# Patient Record
Sex: Female | Born: 1940 | Race: White | Hispanic: No | Marital: Married | Smoking: Former smoker
Health system: Southern US, Community
[De-identification: ages and names within clinical notes are randomized; demographics above are authoritative.]

## PROBLEM LIST (undated history)

## (undated) DIAGNOSIS — D649 Anemia, unspecified: Secondary | ICD-10-CM

## (undated) DIAGNOSIS — K743 Primary biliary cirrhosis: Secondary | ICD-10-CM

## (undated) DIAGNOSIS — E559 Vitamin D deficiency, unspecified: Secondary | ICD-10-CM

## (undated) DIAGNOSIS — M069 Rheumatoid arthritis, unspecified: Secondary | ICD-10-CM

## (undated) DIAGNOSIS — E7800 Pure hypercholesterolemia, unspecified: Secondary | ICD-10-CM

## (undated) DIAGNOSIS — I10 Essential (primary) hypertension: Secondary | ICD-10-CM

## (undated) HISTORY — PX: LIVER BIOPSY: SHX301

---
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
## 2011-09-11 HISTORY — PX: NASAL POLYP EXCISION: SHX2068

## 2012-05-24 ENCOUNTER — Observation Stay: Payer: Self-pay | Admitting: Internal Medicine

## 2012-05-24 LAB — COMPREHENSIVE METABOLIC PANEL
Albumin: 3.3 g/dL — ABNORMAL LOW (ref 3.4–5.0)
Alkaline Phosphatase: 109 U/L (ref 50–136)
BUN: 16 mg/dL (ref 7–18)
EGFR (Non-African Amer.): >60
Glucose: 103 mg/dL — ABNORMAL HIGH (ref 65–99)
Osmolality: 273 (ref 275–301)
Potassium: 4.1 mmol/L (ref 3.5–5.1)
SGOT(AST): 15 U/L (ref 15–37)
Sodium: 136 mmol/L (ref 136–145)
Total Protein: 8.1 g/dL (ref 6.4–8.2)

## 2012-05-24 LAB — CBC
HCT: 40 % (ref 35.0–47.0)
HGB: 13.4 g/dL (ref 12.0–16.0)
MCH: 28.1 pg (ref 26.0–34.0)
MCHC: 33.5 g/dL (ref 32.0–36.0)
RDW: 15.5 % — ABNORMAL HIGH (ref 11.5–14.5)
WBC: 13.4 10*3/uL — ABNORMAL HIGH (ref 3.6–11.0)

## 2012-05-24 LAB — PROTIME-INR
INR: 0.9
Prothrombin Time: 12.5 secs (ref 11.5–14.7)

## 2012-05-24 LAB — APTT: Activated PTT: 29.3 secs (ref 23.6–35.9)

## 2012-05-25 LAB — CBC WITH DIFFERENTIAL/PLATELET
Eosinophil %: 1 %
HGB: 12.6 g/dL (ref 12.0–16.0)
MCHC: 34.1 g/dL (ref 32.0–36.0)
MCV: 84 fL (ref 80–100)
Monocyte %: 4.7 %
Neutrophil #: 7.7 10*3/uL — ABNORMAL HIGH (ref 1.4–6.5)
Neutrophil %: 76.6 %
Platelet: 270 10*3/uL (ref 150–440)
RDW: 15.3 % — ABNORMAL HIGH (ref 11.5–14.5)
WBC: 10.1 10*3/uL (ref 3.6–11.0)

## 2012-05-25 LAB — BASIC METABOLIC PANEL
Anion Gap: 5 — ABNORMAL LOW (ref 7–16)
BUN: 13 mg/dL (ref 7–18)
Calcium, Total: 9.3 mg/dL (ref 8.5–10.1)
Co2: 27 mmol/L (ref 21–32)
Creatinine: 0.85 mg/dL (ref 0.60–1.30)
EGFR (African American): >60
EGFR (Non-African Amer.): >60
Glucose: 107 mg/dL — ABNORMAL HIGH (ref 65–99)
Osmolality: 273 (ref 275–301)
Potassium: 4.2 mmol/L (ref 3.5–5.1)
Sodium: 136 mmol/L (ref 136–145)

## 2012-06-03 ENCOUNTER — Ambulatory Visit: Payer: Self-pay | Admitting: Otolaryngology

## 2012-06-05 ENCOUNTER — Ambulatory Visit: Payer: Self-pay | Admitting: Otolaryngology

## 2012-06-06 LAB — PATHOLOGY REPORT

## 2013-11-18 ENCOUNTER — Ambulatory Visit: Payer: Self-pay | Admitting: Family Medicine

## 2014-12-24 ENCOUNTER — Other Ambulatory Visit: Payer: Self-pay | Admitting: Family Medicine

## 2014-12-24 DIAGNOSIS — Z13820 Encounter for screening for osteoporosis: Secondary | ICD-10-CM

## 2014-12-24 DIAGNOSIS — Z1231 Encounter for screening mammogram for malignant neoplasm of breast: Secondary | ICD-10-CM

## 2014-12-28 NOTE — Discharge Summary (Signed)
PATIENT NAME:  ARIANIE, COUSE MR#:  [id number] DATE OF BIRTH:  10/04/1940  DATE OF ADMISSION:  05/24/2012 DATE OF DISCHARGE:  05/25/2012  ADDENDUM  Was counseled regarding smoking cessation. Five minutes were spent on smoking cessation. She was offered a nicotine patch which she will use at home. She is strongly recommended to quit.   ____________________________ Lafonda Mosses. Posey Pronto, MD shp:cms D: 05/25/2012 13:53:12 ET T: 05/26/2012 09:06:17 ET JOB#: [id number]  cc: Allye Hoyos H. Posey Pronto, MD, <Dictator> Alric Seton MD ELECTRONICALLY SIGNED 05/29/2012 21:48

## 2014-12-28 NOTE — Op Note (Signed)
PATIENT NAME:  Sandra [name], Sandra [name] MR#:  [id number] DATE OF BIRTH:  January 06, 1941  DATE OF PROCEDURE:  06/05/2012  PREOPERATIVE DIAGNOSIS: Nasopharyngeal mass.  POSTOPERATIVE DIAGNOSIS: Nasopharyngeal mass.   PROCEDURE: Nasopharyngoscopy with nasopharyngeal biopsies.   SURGEON: Janalee Dane, MD   DESCRIPTION OF PROCEDURE: The patient was placed in the supine position on the operating room table. After general endotracheal anesthesia had been induced, the patient was turned 90 degrees counterclockwise from anesthesia. The nose was anesthetized with phenylephrine lidocaine soaked pledgets two on each side of the nose. The patient was prepped and draped in the usual fashion. The pledgets were removed. The 0 degree Hopkins rod was used to perform endoscopy. The nasopharyngeal mass was photographed and biopsied with through-cutting forceps multiple times. The nasopharynx was then conservatively cauterized with suction cautery. A conservative amount of Surgiflo was then placed basically painted in the nasopharynx and along the inferior turbinate mucosa bilaterally. The patient was then returned to anesthesia, allowed to emerge from anesthesia in the operating room, and taken to the recovery room in stable condition. There were no complications. Estimated blood loss less than 10 mL.   ____________________________ J. Nadeen Landau, MD jmc:drc D: 06/05/2012 15:34:12 ET T: 06/05/2012 17:30:36 ET JOB#: [id number]  cc: Janalee Dane, MD, <Dictator> Nicholos Johns MD ELECTRONICALLY SIGNED 07/10/2012 10:30

## 2014-12-28 NOTE — Consult Note (Signed)
PATIENT NAME:  Sandra [name], Sandra [name] MR#:  [id number] DATE OF BIRTH:  October 05, 1940  DATE OF CONSULTATION:  05/24/2012  REFERRING PHYSICIAN:  Dr. Dustin Flock  CONSULTING PHYSICIAN:  Janalee Dane, MD  HISTORY OF PRESENT ILLNESS: The patient is a 74 year old white female who was admitted earlier today for nosebleed and transient hypertension thought to be related to the stress of the nosebleed and discomfort from packing the nose. The patient has not seen a physician in five years. She smokes fairly heavily, admits to about a pack of cigarettes a day. She has not had previous nosebleeds, denies any previous nasal or sinus surgery.   ALLERGIES: None.   MEDICATIONS: Metoprolol (since admission).   SOCIAL HISTORY: Smokes a pack of cigarettes a day.   FAMILY HISTORY: Negative for family history of coagulopathies.   PHYSICAL EXAMINATION:  GENERAL: The patient is anxious dabbing constantly at the nasal tampons, collecting all of the Kleenexes.    NOSE: There is a Merocel in the right naris. There is a [employer] in the left naris. There is no active bleeding anteriorly.   ORAL CAVITY AND OROPHARYNX: There is no bleeding posteriorly. Poor dentition.   NECK: The trachea is midline. No masses in the neck.   IMPRESSION AND RECOMMENDATIONS: Epistaxis likely related to dryness from the smoking and recent change in weather. I have recommended a humidifier to be used at the bedside. She can take that home with her when she is discharged. The patient is already on antibiotics for prophylactic coverage of toxic shock syndrome thanks to Dr. Posey Pronto. Essentially the packs will need to stay in for five days. They can be removed in our clinic and we will scope her at that time to look for any other causes of bleeding but essentially at this point the packs need to apply the pressure necessary to keep this bleeding from recurring. In the meantime her blood pressure should be well controlled which it is now and I  have reinforced the need with the patient and her daughter to avoid dabbing at the nose constantly.   ____________________________ J. Nadeen Landau, MD jmc:cms D: 05/24/2012 12:49:16 ET T: 05/24/2012 13:16:01 ET JOB#: [id number]  cc: Janalee Dane, MD, <Dictator> [facility] Nicholos Johns MD ELECTRONICALLY SIGNED 06/05/2012 7:38

## 2014-12-28 NOTE — Discharge Summary (Signed)
PATIENT NAME:  Sandra [name], Sandra [name] MR#:  [id number] DATE OF BIRTH:  November 26, 1940  DATE OF ADMISSION:  05/24/2012 DATE OF DISCHARGE:  05/25/2012  ADMITTING DIAGNOSIS: Severe nasal bleed.   DISCHARGE DIAGNOSES:  1. Nasal bleed, possibly related to dry nose. Patient was seen by ear, nose and throat, has both of her nostrils packed. She will need to follow up with Dr. Carlis Abbott. Will need direct visualization of her nares. 2. Elevated blood pressure possibly related to the nasal bleed. It was not extremely elevated to caused the bleeding. However, since admission her blood pressure has been stable with low dose metoprolol.  3. Nausea related to bleeding from the nose causing gastric irritation, now resolved.  4. Nicotine addiction. Patient counseled and offered nicotine patch to assist with smoking cessation.   CONSULTANT: Dr. Carlis Abbott.   LABORATORY, DIAGNOSTIC AND RADIOLOGICAL DATA: INR 0.9. WBC 13.4 on admission. Hemoglobin 13.4, platelet count 297. Most recent hemoglobin 12.6. BMP glucose 103, BUN 16, creatinine 0.41, sodium 136, potassium 4.1, chloride 107, CO2 23, calcium 9.3. LFTs were normal except albumin of 3.3.    HOSPITAL COURSE: Please refer to history and physical done yesterday. Patient is a pleasant 74 year old white female with no significant past medical history except smokes presented with complaint of having severe nasal bleed that woke her up. Patient had a similar episode of bleeding a few days prior which stopped on its own. Patient came to the ED and had to have bilateral nasal packing. Her blood pressure when she arrived was in the 170s. She received a dose of Lopressor and blood pressure did decrease. It was unclear whether the elevated blood pressure could have been related to her having acute bleed. Just being on low dose Lopressor her blood is stable at 132/66. She was seen in consultation by ENT, Dr. Carlis Abbott, who felt that her bleeding could have been related to dryness from the smoking  and recent changes in the weather. He recommended patient use a humidifier. Also recommended prophylactic antibiotic and recommended that nasal packs to stay in her nose. These will be removed in his clinic. Patient over the night has had a little bit of bleeding, but nothing significant. Her hemoglobin is stable. At this time she is stable for discharge.   DISCHARGE MEDICATIONS:  1. Metoprolol tartrate 12.5, 1 tab p.o. b.i.d.  2. Augmentin 1 tab p.o. b.i.d. x5 days.  3. Nicotine patch 14 mg, change daily.   DIET: Low sodium.   TIMEFRAME FOR FOLLOW UP: 1 to 2 weeks with Dr. Carlis Abbott, ENT. Also patient needs to establish primary M.D. Patient is told to stop smoking. Also use humidifier all the time. She is to obtain a blood pressure cuff, check blood pressure twice daily. Blood pressure starts trending down or if systolic starts going below 102 she is to stop to metoprolol.   TIME SPENT: 35 minutes.   ____________________________ Lafonda Mosses Posey Pronto, MD shp:cms D: 05/25/2012 13:28:49 ET T: 05/26/2012 08:59:50 ET JOB#: [id number]  cc: Cayle Cordoba H. Posey Pronto, MD, <Dictator> Alric Seton MD ELECTRONICALLY SIGNED 05/29/2012 21:48

## 2014-12-28 NOTE — H&P (Signed)
PATIENT NAME:  Sandra [name], Sandra [name] MR#:  [id number] DATE OF BIRTH:  01/29/1941  DATE OF ADMISSION:  05/24/2012  PRIMARY CARE PHYSICIAN: None  ED REFERRING PHYSICIAN: Dr. Baron Sane   CHIEF COMPLAINT: Severe nasal bleed.   HISTORY OF PRESENT ILLNESS: Patient is a 74 year old white female with no significant past medical history, has not seen a physician in five years, who reports that last week she had an episode where she started bleeding from her nose spontaneously and then it stopped on its own and then earlier today she was sleeping and all of a sudden she started having nasal bleed. Patient had severe nasal bleed which continued to persist when the ED M.D. arrived. She had to have nasal packing for the bleed. Patient reports no trauma to the nose. She denies any history of spontaneous bleeding. She has no known history of hypertension. Her blood pressure was slightly elevated on presentation. She received a dose of metoprolol and her blood pressure is 143/67 currently. Otherwise, patient denies any fevers, chills. No cough. Denies any significant allergies. Has not noticed any type of growth in her nose. She otherwise complains of some nausea but no emesis. She denies any chest pain or shortness of breath.   PAST MEDICAL HISTORY: None.   PAST SURGICAL HISTORY: None.   ALLERGIES: None.   MEDICATIONS: None.   SOCIAL HISTORY: Does not smoke. Does not drink. No drugs.   FAMILY HISTORY: No history of easy bleeding. No history of any problems with hemostasis.   REVIEW OF SYSTEMS: CONSTITUTIONAL: Denies any fevers, fatigue, weakness. No pain, weight loss. No weight gain. EYES: No blurred or double vision. No pain. No redness. No inflammation. No glaucoma. No cataracts. ENT: No tinnitus. No ear pain. No hearing loss. No seasonal or year-round allergies. Has complained of epistaxis. No nasal discharge prior to the bleeding. No snoring. No postnasal drip. No sinus pain. No dentures. No redness of  the oropharynx. No difficulty swallowing. RESPIRATORY: No cough. No wheezing. No hemoptysis. No chronic obstructive pulmonary disease. CARDIOVASCULAR: No chest pain. No orthopnea. No edema. No arrhythmia. GASTROINTESTINAL: Complains of some nausea. No abdominal pain. No hematemesis. No melena. No ulcer. No gastroesophageal reflux disease. No irritable bowel syndrome. GENITOURINARY: Denies any dysuria, hematuria, renal calculus or frequency. ENDO: Denies any polydipsia, nocturia, or thyroid problems. HEME/LYMPH: Denies anemia, easy bruisability, or bleeding. SKIN: No acne. No rash. No changes in mole, hair or skin. MUSCULOSKELETAL: Denies any pain in neck, back, or shoulder. NEURO: No numbness. No cerebrovascular accident. No transient ischemic attack. PSYCHIATRIC: No anxiety. No insomnia. No ADD.   PHYSICAL EXAMINATION:  VITAL SIGNS: Temperature 97.6, pulse 64, respirations 20, blood pressure 143/67, O2 97%.   GENERAL: Patient is a well developed, well nourished Caucasian female in no acute distress.   HEENT: Head atraumatic, normocephalic. Extraocular movements intact. There is no conjunctival pallor. No scleral icterus. Nasal exam shows that she has got both of her nostrils packed. The packing is bloody. She has no current active bleeding noted. Oropharynx is clear without any exudates.   NECK: No thyromegaly. No carotid bruits.   CARDIOVASCULAR: Regular rate and rhythm. No murmurs, rubs, clicks, or gallops. PMI is not displaced.   LUNGS: Clear to auscultation bilaterally without any rales, rhonchi, wheezing.   ABDOMEN: Soft, nontender, nondistended. Positive bowel sounds x4.   EXTREMITIES: No clubbing, cyanosis, edema.   SKIN: No rash.   LYMPHATICS: No lymph nodes palpable.   VASCULAR: Good DP, PT pulses.   PSYCHIATRIC: Not  anxious or depressed.   NEUROLOGICAL: Awake, alert, oriented x3. No focal deficits. Cranial nerves II through XII grossly intact.   LABORATORY, DIAGNOSTIC, AND  RADIOLOGICAL DATA: Blood glucose 103, BUN 16, creatinine 0.41, sodium 136, chloride 107, CO2 23, calcium 9.3. LFTs were normal except albumin of 3.3. WBC 13.4, hemoglobin 13.4, platelet count 297, INR 0.9.   ASSESSMENT AND PLAN: Patient is a 74 year old healthy female presents with acute onset of nasal bleed. 1. Severe nasal bleed. Patient's platelet count and INR are normal suggestive of likely spontaneous bleed. ENT will see the patient. The ED physician has spoken to Dr. Carlis Abbott already. Patient will eventually need visualization of her nasal canal to make sure that there is no growth or any other abnormality causing her to have a bleed such as this. Her blood pressure was slightly elevated on presentation, could be just related to the bleed itself. At this time will give her low dose metoprolol. She did have metoprolol given in the ED.   2. Elevated blood pressure, likely due to the bleed, however, will follow her blood pressure. Start on low dose metoprolol.  3. Elevated WBC count, likely stress induced due to bleed. In light of her acute bleed will place her on p.o. antibiotics with Augmentin.  4. Miscellaneous. Patient is ambulatory. She does not need any anticoagulation especially with the bleed.   TIME SPENT: 35 minutes.  ____________________________ Lafonda Mosses Posey Pronto, MD shp:cms D: 05/24/2012 08:46:58 ET T: 05/24/2012 09:10:08 ET JOB#: [id number]  cc: Yago Ludvigsen H. Posey Pronto, MD, <Dictator> Alric Seton MD ELECTRONICALLY SIGNED 05/24/2012 16:26

## 2015-01-18 ENCOUNTER — Ambulatory Visit
Admission: RE | Admit: 2015-01-18 | Discharge: 2015-01-18 | Disposition: A | Discharge status: Home / Self Care | Payer: Medicare PPO | Source: Ambulatory Visit | Attending: Family Medicine | Admitting: Family Medicine

## 2015-01-18 DIAGNOSIS — Z1231 Encounter for screening mammogram for malignant neoplasm of breast: Secondary | ICD-10-CM

## 2015-01-18 DIAGNOSIS — Z13820 Encounter for screening for osteoporosis: Secondary | ICD-10-CM

## 2015-11-23 ENCOUNTER — Ambulatory Visit
Admission: RE | Admit: 2015-11-23 | Discharge: 2015-11-23 | Disposition: A | Discharge status: Home / Self Care | Payer: Medicare PPO | Source: Ambulatory Visit | Attending: Family Medicine | Admitting: Family Medicine

## 2015-11-23 ENCOUNTER — Other Ambulatory Visit: Payer: Self-pay | Admitting: Family Medicine

## 2015-11-23 DIAGNOSIS — M25462 Effusion, left knee: Secondary | ICD-10-CM | POA: Insufficient documentation

## 2015-11-23 DIAGNOSIS — M25461 Effusion, right knee: Secondary | ICD-10-CM | POA: Insufficient documentation

## 2015-11-23 DIAGNOSIS — M1711 Unilateral primary osteoarthritis, right knee: Secondary | ICD-10-CM | POA: Diagnosis not present

## 2015-11-23 DIAGNOSIS — M25562 Pain in left knee: Principal | ICD-10-CM

## 2015-11-23 DIAGNOSIS — M25561 Pain in right knee: Secondary | ICD-10-CM

## 2015-11-23 IMAGING — CR DG KNEE AP/LAT W/ SUNRISE*L*
3 series · 3 of 3 positions shown · non-contrast
Comparison: None.

CLINICAL DATA: Bilateral knee pain, swelling beginning 6-7 months
ago.

EXAM:
LEFT KNEE 3 VIEWS

[knee ap]
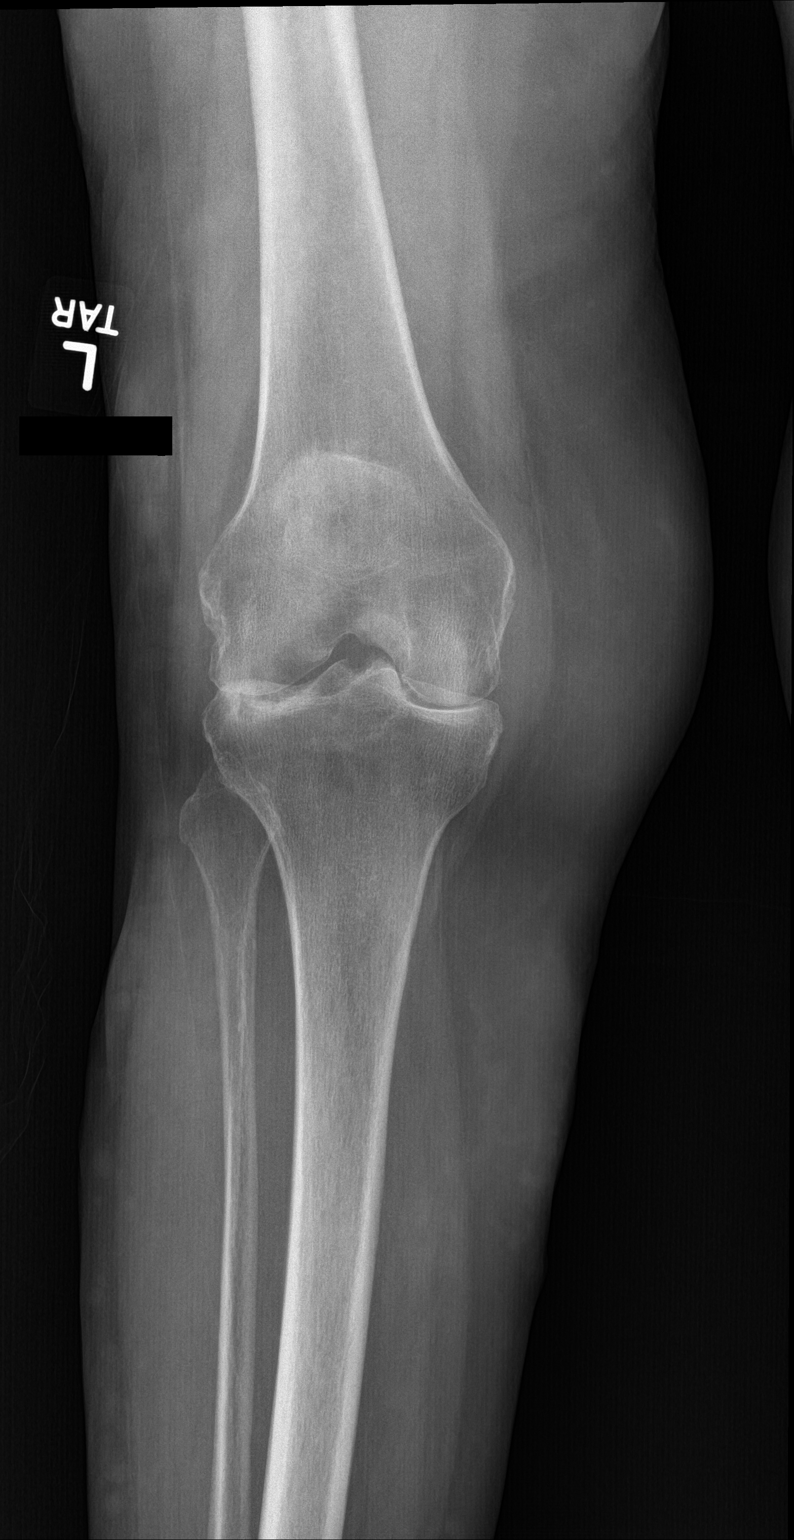

[knee lat]
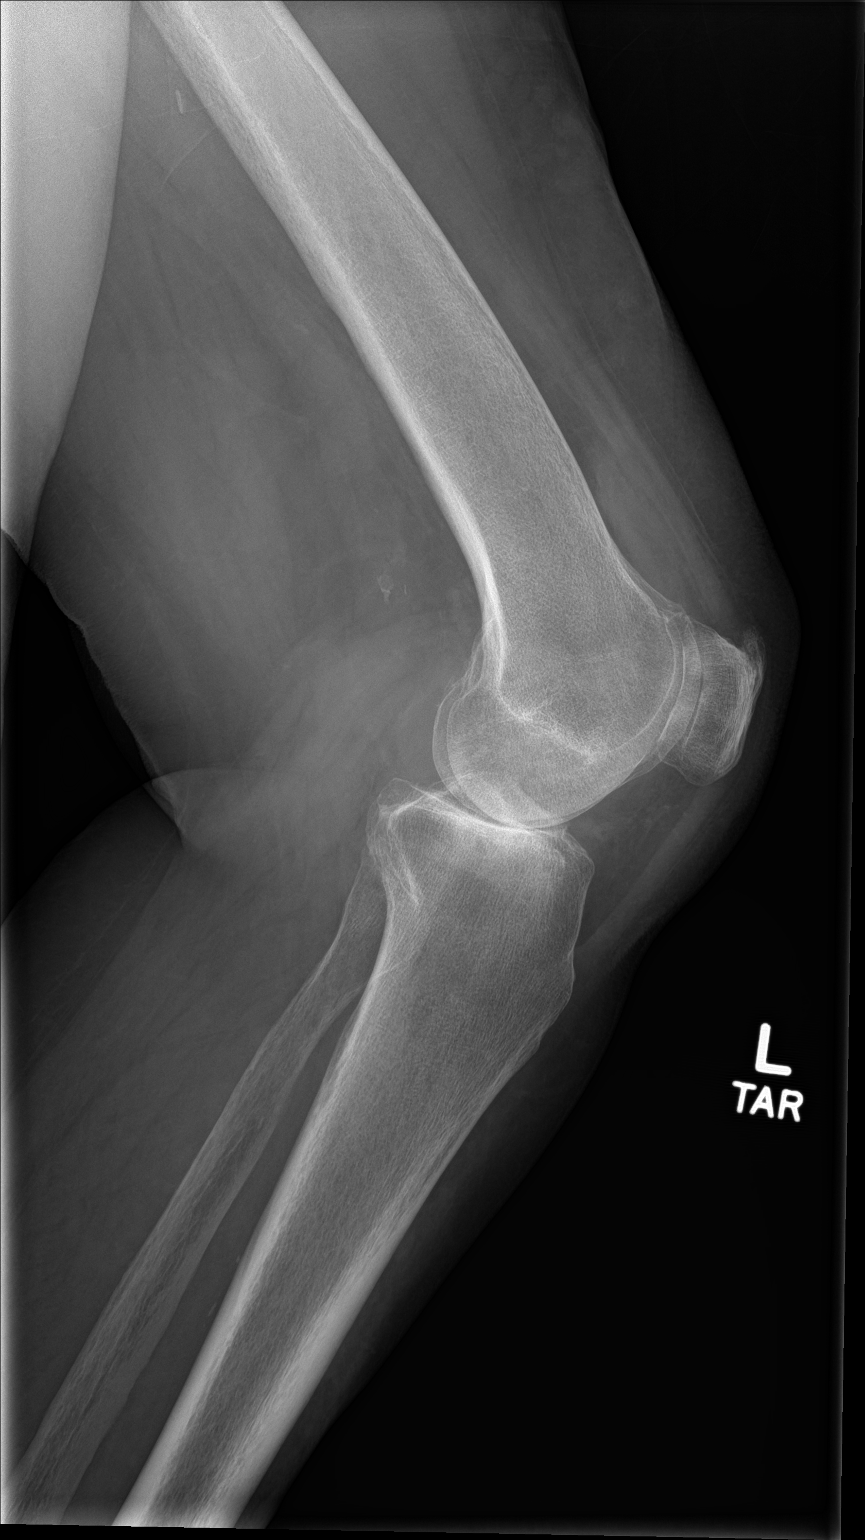

[patella skyline]
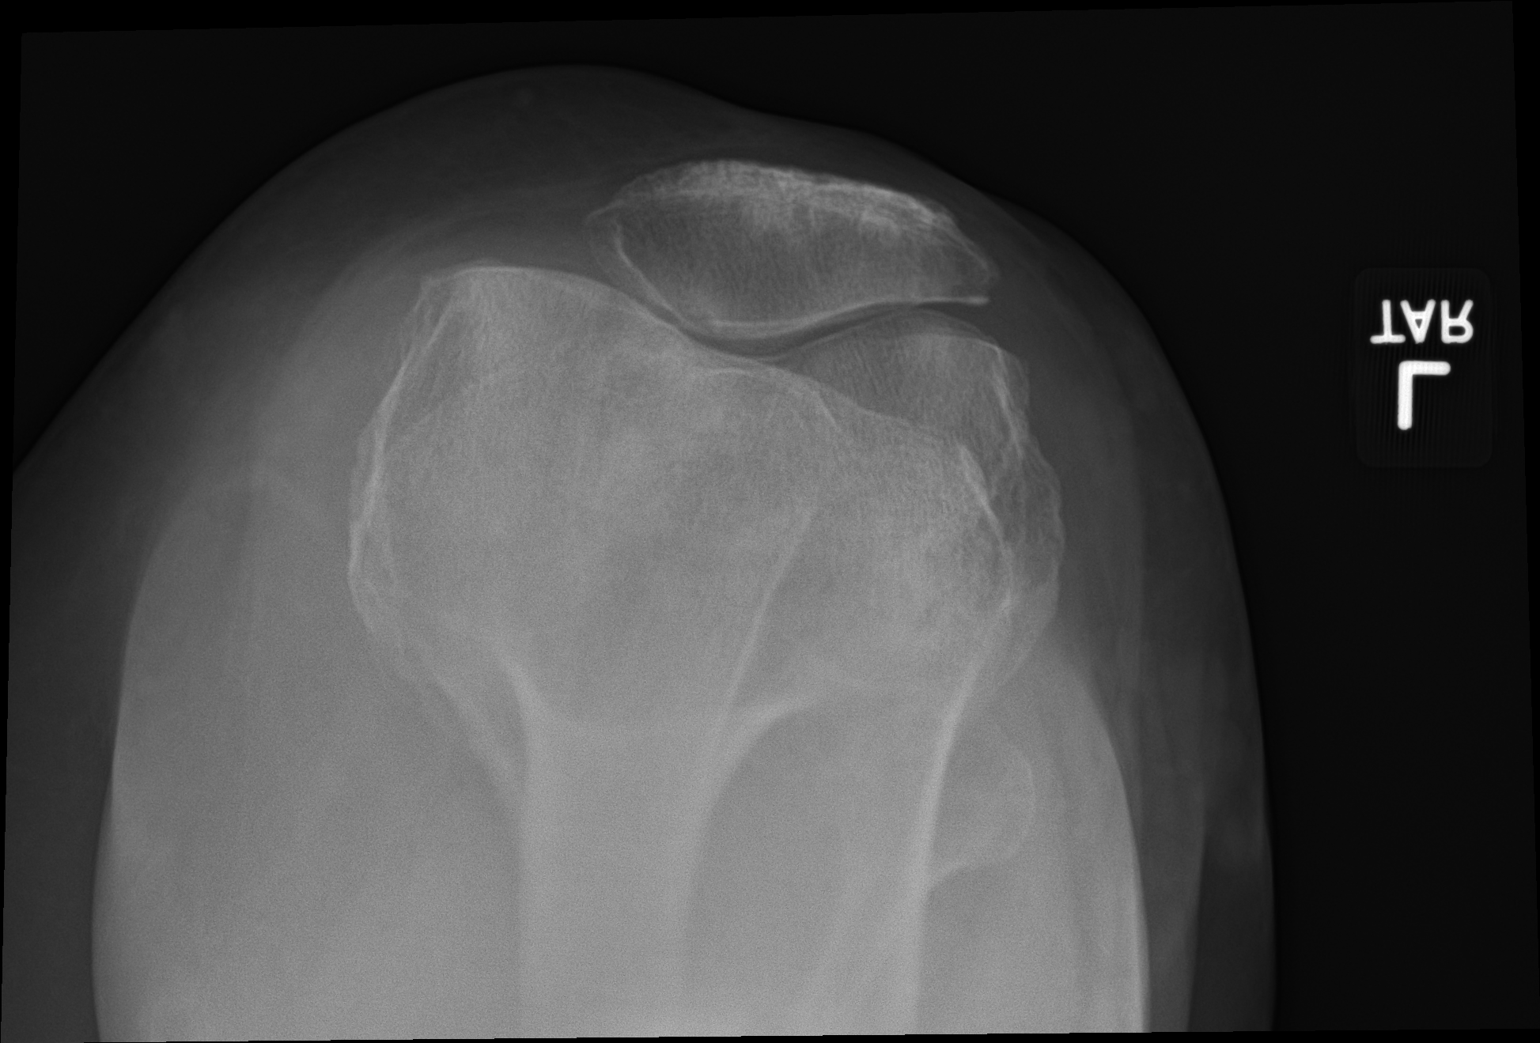

[3 of 3 positions shown; findings below may reference images not displayed]

FINDINGS: Advanced tricompartment degenerative changes with joint space loss,
subchondral sclerosis, and osteophyte formation. Small to moderate
joint effusion. No acute bony abnormality. Specifically, no
fracture, subluxation, or dislocation. Soft tissues are intact.
IMPRESSION: Advanced degenerative changes with small to moderate joint effusion.
No acute bony abnormality.

## 2016-08-10 ENCOUNTER — Other Ambulatory Visit: Payer: Self-pay | Admitting: Internal Medicine

## 2016-08-10 DIAGNOSIS — K824 Cholesterolosis of gallbladder: Secondary | ICD-10-CM

## 2016-08-21 ENCOUNTER — Ambulatory Visit
Admission: RE | Admit: 2016-08-21 | Discharge: 2016-08-21 | Disposition: A | Discharge status: Home / Self Care | Payer: Medicare PPO | Source: Ambulatory Visit | Attending: Internal Medicine | Admitting: Internal Medicine

## 2016-08-21 DIAGNOSIS — K824 Cholesterolosis of gallbladder: Secondary | ICD-10-CM | POA: Diagnosis not present

## 2016-09-10 HISTORY — PX: CHOLECYSTECTOMY: SHX55

## 2019-03-24 ENCOUNTER — Other Ambulatory Visit: Payer: Self-pay | Admitting: Rheumatology

## 2019-03-24 DIAGNOSIS — M0579 Rheumatoid arthritis with rheumatoid factor of multiple sites without organ or systems involvement: Secondary | ICD-10-CM

## 2019-03-24 DIAGNOSIS — Z7952 Long term (current) use of systemic steroids: Secondary | ICD-10-CM

## 2019-04-01 ENCOUNTER — Ambulatory Visit
Admission: RE | Admit: 2019-04-01 | Discharge: 2019-04-01 | Disposition: A | Discharge status: Home / Self Care | Payer: Medicare PPO | Source: Ambulatory Visit | Attending: Rheumatology | Admitting: Rheumatology

## 2019-04-01 ENCOUNTER — Other Ambulatory Visit: Payer: Self-pay

## 2019-04-01 ENCOUNTER — Encounter (INDEPENDENT_AMBULATORY_CARE_PROVIDER_SITE_OTHER): Payer: Self-pay

## 2019-04-01 DIAGNOSIS — M0579 Rheumatoid arthritis with rheumatoid factor of multiple sites without organ or systems involvement: Secondary | ICD-10-CM | POA: Diagnosis present

## 2019-04-01 DIAGNOSIS — Z7952 Long term (current) use of systemic steroids: Secondary | ICD-10-CM | POA: Diagnosis present

## 2019-07-16 ENCOUNTER — Other Ambulatory Visit
Admission: RE | Admit: 2019-07-16 | Discharge: 2019-07-16 | Disposition: A | Discharge status: Home / Self Care | Payer: Medicare PPO | Source: Ambulatory Visit | Attending: Ophthalmology | Admitting: Ophthalmology

## 2019-07-16 ENCOUNTER — Other Ambulatory Visit: Payer: Self-pay

## 2019-07-16 DIAGNOSIS — Z01812 Encounter for preprocedural laboratory examination: Secondary | ICD-10-CM | POA: Insufficient documentation

## 2019-07-16 DIAGNOSIS — Z20828 Contact with and (suspected) exposure to other viral communicable diseases: Secondary | ICD-10-CM | POA: Insufficient documentation

## 2019-07-16 LAB — SARS CORONAVIRUS 2 (TAT 6-24 HRS): SARS Coronavirus 2: NEGATIVE

## 2019-07-16 NOTE — Discharge Instructions (Signed)

## 2019-07-20 ENCOUNTER — Ambulatory Visit: Payer: Medicare PPO | Admitting: Anesthesiology

## 2019-07-20 ENCOUNTER — Ambulatory Visit
Admission: RE | Admit: 2019-07-20 | Discharge: 2019-07-20 | Disposition: A | Discharge status: Home / Self Care | Payer: Medicare PPO | Attending: Ophthalmology | Admitting: Ophthalmology

## 2019-07-20 ENCOUNTER — Other Ambulatory Visit: Payer: Self-pay

## 2019-07-20 ENCOUNTER — Encounter
Admission: RE | Disposition: A | Discharge status: Home / Self Care | Payer: Self-pay | Source: Home / Self Care | Attending: Ophthalmology

## 2019-07-20 DIAGNOSIS — K743 Primary biliary cirrhosis: Secondary | ICD-10-CM | POA: Diagnosis not present

## 2019-07-20 DIAGNOSIS — E7800 Pure hypercholesterolemia, unspecified: Secondary | ICD-10-CM | POA: Diagnosis not present

## 2019-07-20 DIAGNOSIS — Z7983 Long term (current) use of bisphosphonates: Secondary | ICD-10-CM | POA: Diagnosis not present

## 2019-07-20 DIAGNOSIS — Z7952 Long term (current) use of systemic steroids: Secondary | ICD-10-CM | POA: Diagnosis not present

## 2019-07-20 DIAGNOSIS — H2512 Age-related nuclear cataract, left eye: Secondary | ICD-10-CM | POA: Insufficient documentation

## 2019-07-20 DIAGNOSIS — Z79899 Other long term (current) drug therapy: Secondary | ICD-10-CM | POA: Insufficient documentation

## 2019-07-20 DIAGNOSIS — M069 Rheumatoid arthritis, unspecified: Secondary | ICD-10-CM | POA: Insufficient documentation

## 2019-07-20 DIAGNOSIS — Z87891 Personal history of nicotine dependence: Secondary | ICD-10-CM | POA: Diagnosis not present

## 2019-07-20 DIAGNOSIS — I10 Essential (primary) hypertension: Secondary | ICD-10-CM | POA: Insufficient documentation

## 2019-07-20 HISTORY — DX: Primary biliary cirrhosis: K74.3

## 2019-07-20 HISTORY — DX: Anemia, unspecified: D64.9

## 2019-07-20 HISTORY — DX: Pure hypercholesterolemia, unspecified: E78.00

## 2019-07-20 HISTORY — DX: Rheumatoid arthritis, unspecified: M06.9

## 2019-07-20 HISTORY — DX: Essential (primary) hypertension: I10

## 2019-07-20 HISTORY — PX: CATARACT EXTRACTION W/PHACO: SHX586

## 2019-07-20 SURGERY — PHACOEMULSIFICATION, CATARACT, WITH IOL INSERTION
Anesthesia: Monitor Anesthesia Care | Site: Eye | Laterality: Left

## 2019-07-20 MED ORDER — MIDAZOLAM HCL 2 MG/2ML IJ SOLN
INTRAMUSCULAR | Status: DC | PRN
  Administered 2019-07-20 (×2): 0.5 mg via INTRAVENOUS

## 2019-07-20 MED ORDER — EPINEPHRINE PF 1 MG/ML IJ SOLN
INTRAOCULAR | Status: DC | PRN
  Administered 2019-07-20: 93 mL via OPHTHALMIC

## 2019-07-20 MED ORDER — FENTANYL CITRATE (PF) 100 MCG/2ML IJ SOLN
INTRAMUSCULAR | Status: DC | PRN
  Administered 2019-07-20: 25 ug via INTRAVENOUS
  Administered 2019-07-20: 50 ug via INTRAVENOUS

## 2019-07-20 MED ORDER — ARMC OPHTHALMIC DILATING DROPS
1.0000 "application " | OPHTHALMIC | Status: DC | PRN
  Administered 2019-07-20 (×3): 1 via OPHTHALMIC

## 2019-07-20 MED ORDER — LIDOCAINE HCL (PF) 2 % IJ SOLN
INTRAOCULAR | Status: DC | PRN
  Administered 2019-07-20: 2 mL via INTRAOCULAR

## 2019-07-20 MED ORDER — MOXIFLOXACIN HCL 0.5 % OP SOLN
OPHTHALMIC | Status: DC | PRN
  Administered 2019-07-20: 0.2 mL via OPHTHALMIC

## 2019-07-20 MED ORDER — SODIUM HYALURONATE 23 MG/ML IO SOLN
INTRAOCULAR | Status: DC | PRN
  Administered 2019-07-20: 0.6 mL via INTRAOCULAR

## 2019-07-20 MED ORDER — TETRACAINE HCL 0.5 % OP SOLN
1.0000 [drp] | OPHTHALMIC | Status: DC | PRN
  Administered 2019-07-20 (×3): 1 [drp] via OPHTHALMIC

## 2019-07-20 MED ORDER — ONDANSETRON HCL 4 MG/2ML IJ SOLN: 4.0000 mg | Freq: Once | INTRAMUSCULAR | Status: DC | PRN

## 2019-07-20 MED ORDER — LACTATED RINGERS IV SOLN: INTRAVENOUS | Status: DC

## 2019-07-20 MED ORDER — SODIUM HYALURONATE 10 MG/ML IO SOLN
INTRAOCULAR | Status: DC | PRN
  Administered 2019-07-20: 0.55 mL via INTRAOCULAR

## 2019-07-20 SURGICAL SUPPLY — 19 items
CANNULA ANT/CHMB 27G (MISCELLANEOUS) ×2 IMPLANT
CANNULA ANT/CHMB 27GA (MISCELLANEOUS) ×6 IMPLANT
DISSECTOR HYDRO NUCLEUS 50X22 (MISCELLANEOUS) ×3 IMPLANT
GLOVE SURG LX 7.5 STRW (GLOVE) ×2
GLOVE SURG LX STRL 7.5 STRW (GLOVE) ×1 IMPLANT
GLOVE SURG SYN 8.5  E (GLOVE) ×2
GLOVE SURG SYN 8.5 E (GLOVE) ×1 IMPLANT
GLOVE SURG SYN 8.5 PF PI (GLOVE) ×1 IMPLANT
GOWN STRL REUS W/ TWL LRG LVL3 (GOWN DISPOSABLE) ×2 IMPLANT
GOWN STRL REUS W/TWL LRG LVL3 (GOWN DISPOSABLE) ×4
LENS IOL TECNIS ITEC 21.0 (Intraocular Lens) ×2 IMPLANT
MARKER SKIN DUAL TIP RULER LAB (MISCELLANEOUS) ×3 IMPLANT
PACK DR. KING ARMS (PACKS) ×3 IMPLANT
PACK EYE AFTER SURG (MISCELLANEOUS) ×3 IMPLANT
PACK OPTHALMIC (MISCELLANEOUS) ×3 IMPLANT
SYR 3ML LL SCALE MARK (SYRINGE) ×3 IMPLANT
SYR TB 1ML LUER SLIP (SYRINGE) ×3 IMPLANT
WATER STERILE IRR 250ML POUR (IV SOLUTION) ×3 IMPLANT
WIPE NON LINTING 3.25X3.25 (MISCELLANEOUS) ×3 IMPLANT

## 2019-07-20 NOTE — Anesthesia Preprocedure Evaluation (Signed)
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   Endo/Other  negative endocrine ROS  Renal/GU negative Renal ROS     Musculoskeletal  (+) Arthritis , Rheumatoid disorders,    Abdominal   Peds  Hematology   Anesthesia Other Findings   Reproductive/Obstetrics                             Anesthesia Physical Anesthesia Plan  ASA: III  Anesthesia Plan: MAC   Post-op Pain Management:    Induction: Intravenous  PONV Risk Score and Plan: 2 and Treatment may vary due to age or medical condition and Midazolam  Airway Management Planned: Natural Airway and Nasal Cannula  Additional Equipment: None  Intra-op Plan:   Post-operative Plan:   Informed Consent: I have reviewed the patients History and Physical, chart, labs and discussed the procedure including the risks, benefits and alternatives for the proposed anesthesia with the patient or authorized representative who has indicated his/her understanding and acceptance.       Plan Discussed with:   Anesthesia Plan Comments:         Anesthesia Quick Evaluation

## 2019-07-20 NOTE — Anesthesia Procedure Notes (Signed)
Procedure Name: MAC Performed by: Kaisei Gilbo, CRNA Pre-anesthesia Checklist: Patient identified, Emergency Drugs available, Suction available, Timeout performed and Patient being monitored Patient Re-evaluated:Patient Re-evaluated prior to induction Oxygen Delivery Method: Nasal cannula Placement Confirmation: positive ETCO2       

## 2019-07-20 NOTE — Anesthesia Postprocedure Evaluation (Signed)
Anesthesia Post Note  Patient: Sandra [name]  Procedure(s) Performed: CATARACT EXTRACTION PHACO AND INTRAOCULAR LENS PLACEMENT (IOC) LEFT 01:15.7        13.3%       10.36 (Left Eye)     Patient location during evaluation: PACU Anesthesia Type: MAC Level of consciousness: awake and alert Pain management: pain level controlled Vital Signs Assessment: post-procedure vital signs reviewed and stable Respiratory status: spontaneous breathing, nonlabored ventilation, respiratory function stable and patient connected to nasal cannula oxygen Cardiovascular status: stable and blood pressure returned to baseline Postop Assessment: no apparent nausea or vomiting Anesthetic complications: no    Adele Barthel Sherrelle Prochazka

## 2019-07-20 NOTE — Op Note (Signed)
OPERATIVE NOTE  Sandra [name] FS:[id number] 07/20/2019   PREOPERATIVE DIAGNOSIS:  Nuclear sclerotic cataract left eye.  H25.12   POSTOPERATIVE DIAGNOSIS:    Nuclear sclerotic cataract left eye.     PROCEDURE:  Phacoemusification with posterior chamber intraocular lens placement of the left eye   LENS:   Implant Name Type Inv. Item Serial No. Manufacturer Lot No. LRB No. Used Action  LENS IOL DIOP 21.0 - [id number] Intraocular Lens LENS IOL DIOP 21.0 [id number]  Left 1 Implanted      Procedure(s): CATARACT EXTRACTION PHACO AND INTRAOCULAR LENS PLACEMENT (IOC) LEFT 01:15.7        13.3%       10.36 (Left)  PCB00 +21.0   ULTRASOUND TIME: 1 minutes 15 seconds.  CDE 10.36   SURGEON:  Benay Pillow, MD, MPH   ANESTHESIA:  Topical with tetracaine drops augmented with 1% preservative-free intracameral lidocaine.  ESTIMATED BLOOD LOSS: <1 mL   COMPLICATIONS:  None.   DESCRIPTION OF PROCEDURE:  The patient was identified in the holding room and transported to the operating room and placed in the supine position under the operating microscope.  The left eye was identified as the operative eye and it was prepped and draped in the usual sterile ophthalmic fashion.   A 1.0 millimeter clear-corneal paracentesis was made at the 5:00 position. 0.5 ml of preservative-free 1% lidocaine with epinephrine was injected into the anterior chamber.  The anterior chamber was filled with Healon 5 viscoelastic.  A 2.4 millimeter keratome was used to make a near-clear corneal incision at the 2:00 position.  A curvilinear capsulorrhexis was made with a cystotome and capsulorrhexis forceps.  Balanced salt solution was used to hydrodissect and hydrodelineate the nucleus.   Phacoemulsification was then used in stop and chop fashion to remove the lens nucleus and epinucleus.  The remaining cortex was then removed using the irrigation and aspiration handpiece. Healon was then placed into the capsular bag to  distend it for lens placement.  A lens was then injected into the capsular bag.  The remaining viscoelastic was aspirated.   Wounds were hydrated with balanced salt solution.  The anterior chamber was inflated to a physiologic pressure with balanced salt solution.  Intracameral vigamox 0.1 mL undiltued was injected into the eye and a drop placed onto the ocular surface.  No wound leaks were noted.  The patient was taken to the recovery room in stable condition without complications of anesthesia or surgery  Benay Pillow 07/20/2019, 11:25 AM

## 2019-07-20 NOTE — H&P (Signed)

## 2019-07-20 NOTE — Transfer of Care (Signed)
Immediate Anesthesia Transfer of Care Note  Patient: Sandra [name]  Procedure(s) Performed: CATARACT EXTRACTION PHACO AND INTRAOCULAR LENS PLACEMENT (IOC) LEFT 01:15.7        13.3%       10.36 (Left Eye)  Patient Location: PACU  Anesthesia Type: MAC  Level of Consciousness: awake, alert  and patient cooperative  Airway and Oxygen Therapy: Patient Spontanous Breathing and Patient connected to supplemental oxygen  Post-op Assessment: Post-op Vital signs reviewed, Patient's Cardiovascular Status Stable, Respiratory Function Stable, Patent Airway and No signs of Nausea or vomiting  Post-op Vital Signs: Reviewed and stable  Complications: No apparent anesthesia complications

## 2019-07-21 ENCOUNTER — Encounter: Payer: Self-pay | Admitting: Ophthalmology

## 2019-07-30 ENCOUNTER — Other Ambulatory Visit: Payer: Self-pay

## 2019-07-30 ENCOUNTER — Encounter: Payer: Self-pay | Admitting: *Deleted

## 2019-08-04 NOTE — Discharge Instructions (Signed)

## 2019-08-07 ENCOUNTER — Other Ambulatory Visit
Admission: RE | Admit: 2019-08-07 | Discharge: 2019-08-07 | Disposition: A | Discharge status: Home / Self Care | Payer: Medicare PPO | Source: Ambulatory Visit | Attending: Ophthalmology | Admitting: Ophthalmology

## 2019-08-07 DIAGNOSIS — Z20828 Contact with and (suspected) exposure to other viral communicable diseases: Secondary | ICD-10-CM | POA: Insufficient documentation

## 2019-08-07 DIAGNOSIS — Z01812 Encounter for preprocedural laboratory examination: Secondary | ICD-10-CM | POA: Diagnosis present

## 2019-08-07 LAB — SARS CORONAVIRUS 2 (TAT 6-24 HRS): SARS Coronavirus 2: NEGATIVE

## 2019-08-10 ENCOUNTER — Other Ambulatory Visit: Payer: Self-pay

## 2019-08-10 ENCOUNTER — Encounter
Admission: RE | Disposition: A | Discharge status: Home / Self Care | Payer: Self-pay | Source: Home / Self Care | Attending: Ophthalmology

## 2019-08-10 ENCOUNTER — Ambulatory Visit: Payer: Medicare PPO | Admitting: Anesthesiology

## 2019-08-10 ENCOUNTER — Ambulatory Visit
Admission: RE | Admit: 2019-08-10 | Discharge: 2019-08-10 | Disposition: A | Discharge status: Home / Self Care | Payer: Medicare PPO | Attending: Ophthalmology | Admitting: Ophthalmology

## 2019-08-10 DIAGNOSIS — Z87891 Personal history of nicotine dependence: Secondary | ICD-10-CM | POA: Diagnosis not present

## 2019-08-10 DIAGNOSIS — I10 Essential (primary) hypertension: Secondary | ICD-10-CM | POA: Diagnosis not present

## 2019-08-10 DIAGNOSIS — H2511 Age-related nuclear cataract, right eye: Secondary | ICD-10-CM | POA: Diagnosis not present

## 2019-08-10 DIAGNOSIS — Z7952 Long term (current) use of systemic steroids: Secondary | ICD-10-CM | POA: Diagnosis not present

## 2019-08-10 DIAGNOSIS — E7800 Pure hypercholesterolemia, unspecified: Secondary | ICD-10-CM | POA: Insufficient documentation

## 2019-08-10 DIAGNOSIS — M069 Rheumatoid arthritis, unspecified: Secondary | ICD-10-CM | POA: Insufficient documentation

## 2019-08-10 DIAGNOSIS — Z79899 Other long term (current) drug therapy: Secondary | ICD-10-CM | POA: Diagnosis not present

## 2019-08-10 HISTORY — PX: CATARACT EXTRACTION W/PHACO: SHX586

## 2019-08-10 SURGERY — PHACOEMULSIFICATION, CATARACT, WITH IOL INSERTION
Anesthesia: Monitor Anesthesia Care | Site: Eye | Laterality: Right

## 2019-08-10 MED ORDER — ACETAMINOPHEN 160 MG/5ML PO SOLN: 325.0000 mg | Freq: Once | ORAL | Status: DC

## 2019-08-10 MED ORDER — SODIUM HYALURONATE 10 MG/ML IO SOLN
INTRAOCULAR | Status: DC | PRN
  Administered 2019-08-10: 0.55 mL via INTRAOCULAR

## 2019-08-10 MED ORDER — MIDAZOLAM HCL 2 MG/2ML IJ SOLN
INTRAMUSCULAR | Status: DC | PRN
  Administered 2019-08-10: 1 mg via INTRAVENOUS

## 2019-08-10 MED ORDER — MOXIFLOXACIN HCL 0.5 % OP SOLN
OPHTHALMIC | Status: DC | PRN
  Administered 2019-08-10: 0.2 mL via OPHTHALMIC

## 2019-08-10 MED ORDER — SODIUM HYALURONATE 23 MG/ML IO SOLN
INTRAOCULAR | Status: DC | PRN
  Administered 2019-08-10: 0.6 mL via INTRAOCULAR

## 2019-08-10 MED ORDER — ACETAMINOPHEN 325 MG PO TABS: 325.0000 mg | ORAL_TABLET | Freq: Once | ORAL | Status: DC

## 2019-08-10 MED ORDER — TETRACAINE HCL 0.5 % OP SOLN
1.0000 [drp] | OPHTHALMIC | Status: DC | PRN
  Administered 2019-08-10 (×3): 1 [drp] via OPHTHALMIC

## 2019-08-10 MED ORDER — FENTANYL CITRATE (PF) 100 MCG/2ML IJ SOLN
INTRAMUSCULAR | Status: DC | PRN
  Administered 2019-08-10 (×2): 50 ug via INTRAVENOUS

## 2019-08-10 MED ORDER — ARMC OPHTHALMIC DILATING DROPS
1.0000 "application " | OPHTHALMIC | Status: DC | PRN
  Administered 2019-08-10 (×3): 1 via OPHTHALMIC

## 2019-08-10 MED ORDER — EPINEPHRINE PF 1 MG/ML IJ SOLN
INTRAOCULAR | Status: DC | PRN
  Administered 2019-08-10: 79 mL via OPHTHALMIC

## 2019-08-10 MED ORDER — LIDOCAINE HCL (PF) 2 % IJ SOLN
INTRAOCULAR | Status: DC | PRN
  Administered 2019-08-10: 1 mL via INTRAOCULAR

## 2019-08-10 MED ORDER — LACTATED RINGERS IV SOLN: 10.0000 mL/h | INTRAVENOUS | Status: DC

## 2019-08-10 SURGICAL SUPPLY — 19 items
CANNULA ANT/CHMB 27G (MISCELLANEOUS) ×2 IMPLANT
CANNULA ANT/CHMB 27GA (MISCELLANEOUS) ×6 IMPLANT
DISSECTOR HYDRO NUCLEUS 50X22 (MISCELLANEOUS) ×3 IMPLANT
GLOVE SURG LX 7.5 STRW (GLOVE) ×2
GLOVE SURG LX STRL 7.5 STRW (GLOVE) ×1 IMPLANT
GLOVE SURG SYN 8.5  E (GLOVE) ×2
GLOVE SURG SYN 8.5 E (GLOVE) ×1 IMPLANT
GLOVE SURG SYN 8.5 PF PI (GLOVE) ×1 IMPLANT
GOWN STRL REUS W/ TWL LRG LVL3 (GOWN DISPOSABLE) ×2 IMPLANT
GOWN STRL REUS W/TWL LRG LVL3 (GOWN DISPOSABLE) ×4
LENS IOL TECNIS ITEC 20.0 (Intraocular Lens) ×2 IMPLANT
MARKER SKIN DUAL TIP RULER LAB (MISCELLANEOUS) ×3 IMPLANT
PACK DR. KING ARMS (PACKS) ×3 IMPLANT
PACK EYE AFTER SURG (MISCELLANEOUS) ×3 IMPLANT
PACK OPTHALMIC (MISCELLANEOUS) ×3 IMPLANT
SYR 3ML LL SCALE MARK (SYRINGE) ×3 IMPLANT
SYR TB 1ML LUER SLIP (SYRINGE) ×3 IMPLANT
WATER STERILE IRR 250ML POUR (IV SOLUTION) ×3 IMPLANT
WIPE NON LINTING 3.25X3.25 (MISCELLANEOUS) ×3 IMPLANT

## 2019-08-10 NOTE — H&P (Signed)

## 2019-08-10 NOTE — Anesthesia Postprocedure Evaluation (Signed)
Anesthesia Post Note  Patient: Sandra [name]  Procedure(s) Performed: CATARACT EXTRACTION PHACO AND INTRAOCULAR LENS PLACEMENT (IOC) RIGHT 7.42  00:44.3 (Right Eye)     Patient location during evaluation: PACU Anesthesia Type: MAC Level of consciousness: awake and alert and oriented Pain management: satisfactory to patient Vital Signs Assessment: post-procedure vital signs reviewed and stable Respiratory status: spontaneous breathing, nonlabored ventilation and respiratory function stable Cardiovascular status: blood pressure returned to baseline and stable Postop Assessment: Adequate PO intake and No signs of nausea or vomiting Anesthetic complications: no    Raliegh Ip

## 2019-08-10 NOTE — Anesthesia Procedure Notes (Signed)
Procedure Name: MAC Performed by: Izetta Dakin, CRNA Pre-anesthesia Checklist: Patient identified, Emergency Drugs available, Suction available, Patient being monitored and Timeout performed Patient Re-evaluated:Patient Re-evaluated prior to induction Oxygen Delivery Method: Nasal cannula

## 2019-08-10 NOTE — Anesthesia Preprocedure Evaluation (Signed)
Anesthesia Evaluation  Patient identified by MRN, date of birth, ID band Patient awake    Reviewed: Allergy & Precautions, H&P , NPO status , Patient's Chart, lab work & pertinent test results  History of Anesthesia Complications Negative for: history of anesthetic complications  Airway Mallampati: II  TM Distance: >3 FB Neck ROM: full    Dental no notable dental hx. 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   Endo/Other  negative endocrine ROS  Renal/GU negative Renal ROS     Musculoskeletal  (+) Arthritis , Rheumatoid disorders,    Abdominal   Peds  Hematology   Anesthesia Other Findings   Reproductive/Obstetrics                             Anesthesia Physical  Anesthesia Plan  ASA: III  Anesthesia Plan: MAC   Post-op Pain Management:    Induction: Intravenous  PONV Risk Score and Plan: 2 and Treatment may vary due to age or medical condition, Midazolam and TIVA  Airway Management Planned: Natural Airway and Nasal Cannula  Additional Equipment: None  Intra-op Plan:   Post-operative Plan:   Informed Consent: I have reviewed the patients History and Physical, chart, labs and discussed the procedure including the risks, benefits and alternatives for the proposed anesthesia with the patient or authorized representative who has indicated his/her understanding and acceptance.       Plan Discussed with: CRNA  Anesthesia Plan Comments:         Anesthesia Quick Evaluation

## 2019-08-10 NOTE — Transfer of Care (Signed)
Immediate Anesthesia Transfer of Care Note  Patient: Sandra [name]  Procedure(s) Performed: CATARACT EXTRACTION PHACO AND INTRAOCULAR LENS PLACEMENT (IOC) RIGHT 7.42  00:44.3 (Right Eye)  Patient Location: PACU  Anesthesia Type: MAC  Level of Consciousness: awake, alert  and patient cooperative  Airway and Oxygen Therapy: Patient Spontanous Breathing and Patient connected to supplemental oxygen  Post-op Assessment: Post-op Vital signs reviewed, Patient's Cardiovascular Status Stable, Respiratory Function Stable, Patent Airway and No signs of Nausea or vomiting  Post-op Vital Signs: Reviewed and stable  Complications: No apparent anesthesia complications

## 2019-08-10 NOTE — Op Note (Signed)
OPERATIVE NOTE  LATWANA MACDUFF [id number] 08/10/2019   PREOPERATIVE DIAGNOSIS:  Nuclear sclerotic cataract right eye.  H25.11   POSTOPERATIVE DIAGNOSIS:    Nuclear sclerotic cataract right eye.     PROCEDURE:  Phacoemusification with posterior chamber intraocular lens placement of the right eye   LENS:   Implant Name Type Inv. Item Serial No. Manufacturer Lot No. LRB No. Used Action  LENS IOL DIOP 20.0 - [id number] Intraocular Lens LENS IOL DIOP 20.0 [id number]  Right 1 Implanted       Procedure(s): CATARACT EXTRACTION PHACO AND INTRAOCULAR LENS PLACEMENT (IOC) RIGHT 7.42  00:44.3 (Right)  PCB00 +20.0   ULTRASOUND TIME: 0 minutes 44 seconds.  CDE 7.42   SURGEON:  Benay Pillow, MD, MPH  ANESTHESIOLOGIST: Anesthesiologist: Ronelle Nigh, MD CRNA: Izetta Dakin, CRNA; Cameron Ali, CRNA   ANESTHESIA:  Topical with tetracaine drops augmented with 1% preservative-free intracameral lidocaine.  ESTIMATED BLOOD LOSS: less than 1 mL.   COMPLICATIONS:  None.   DESCRIPTION OF PROCEDURE:  The patient was identified in the holding room and transported to the operating room and placed in the supine position under the operating microscope.  The right eye was identified as the operative eye and it was prepped and draped in the usual sterile ophthalmic fashion.   A 1.0 millimeter clear-corneal paracentesis was made at the 10:30 position. 0.5 ml of preservative-free 1% lidocaine with epinephrine was injected into the anterior chamber.  The anterior chamber was filled with Healon 5 viscoelastic.  A 2.4 millimeter keratome was used to make a near-clear corneal incision at the 8:00 position.  A curvilinear capsulorrhexis was made with a cystotome and capsulorrhexis forceps.  Balanced salt solution was used to hydrodissect and hydrodelineate the nucleus.   Phacoemulsification was then used in stop and chop fashion to remove the lens nucleus and epinucleus.  The remaining cortex was  then removed using the irrigation and aspiration handpiece. Healon was then placed into the capsular bag to distend it for lens placement.  A lens was then injected into the capsular bag.  The remaining viscoelastic was aspirated.   Wounds were hydrated with balanced salt solution.  The anterior chamber was inflated to a physiologic pressure with balanced salt solution.   Intracameral vigamox 0.1 mL undiluted was injected into the eye and a drop placed onto the ocular surface.  No wound leaks were noted.  The patient was taken to the recovery room in stable condition without complications of anesthesia or surgery  Benay Pillow 08/10/2019, 12:57 PM

## 2019-08-25 ENCOUNTER — Other Ambulatory Visit: Payer: Medicare PPO

## 2019-08-28 ENCOUNTER — Encounter: Admission: RE | Payer: Self-pay | Source: Home / Self Care

## 2019-08-28 ENCOUNTER — Ambulatory Visit: Admission: RE | Admit: 2019-08-28 | Payer: Medicare PPO | Source: Home / Self Care | Admitting: General Surgery

## 2019-08-28 SURGERY — ESOPHAGOGASTRODUODENOSCOPY (EGD) WITH PROPOFOL
Anesthesia: General

## 2019-09-23 ENCOUNTER — Other Ambulatory Visit: Payer: Self-pay

## 2019-09-23 ENCOUNTER — Other Ambulatory Visit
Admission: RE | Admit: 2019-09-23 | Discharge: 2019-09-23 | Disposition: A | Discharge status: Home / Self Care | Payer: Medicare PPO | Source: Ambulatory Visit | Attending: General Surgery | Admitting: General Surgery

## 2019-09-23 DIAGNOSIS — Z20822 Contact with and (suspected) exposure to covid-19: Secondary | ICD-10-CM | POA: Insufficient documentation

## 2019-09-23 DIAGNOSIS — Z01812 Encounter for preprocedural laboratory examination: Secondary | ICD-10-CM | POA: Insufficient documentation

## 2019-09-23 LAB — SARS CORONAVIRUS 2 (TAT 6-24 HRS): SARS Coronavirus 2: NEGATIVE

## 2019-09-24 ENCOUNTER — Encounter: Payer: Self-pay | Admitting: General Surgery

## 2019-09-25 ENCOUNTER — Other Ambulatory Visit: Payer: Self-pay

## 2019-09-25 ENCOUNTER — Ambulatory Visit
Admission: RE | Admit: 2019-09-25 | Discharge: 2019-09-25 | Disposition: A | Discharge status: Home / Self Care | Payer: Medicare PPO | Attending: General Surgery | Admitting: General Surgery

## 2019-09-25 ENCOUNTER — Ambulatory Visit: Payer: Medicare PPO | Admitting: Anesthesiology

## 2019-09-25 ENCOUNTER — Encounter
Admission: RE | Disposition: A | Discharge status: Home / Self Care | Payer: Self-pay | Source: Home / Self Care | Attending: General Surgery

## 2019-09-25 DIAGNOSIS — Z9842 Cataract extraction status, left eye: Secondary | ICD-10-CM | POA: Diagnosis not present

## 2019-09-25 DIAGNOSIS — M069 Rheumatoid arthritis, unspecified: Secondary | ICD-10-CM | POA: Diagnosis not present

## 2019-09-25 DIAGNOSIS — K743 Primary biliary cirrhosis: Secondary | ICD-10-CM | POA: Diagnosis not present

## 2019-09-25 DIAGNOSIS — I10 Essential (primary) hypertension: Secondary | ICD-10-CM | POA: Diagnosis not present

## 2019-09-25 DIAGNOSIS — K5730 Diverticulosis of large intestine without perforation or abscess without bleeding: Secondary | ICD-10-CM | POA: Insufficient documentation

## 2019-09-25 DIAGNOSIS — Z7982 Long term (current) use of aspirin: Secondary | ICD-10-CM | POA: Diagnosis not present

## 2019-09-25 DIAGNOSIS — Z7952 Long term (current) use of systemic steroids: Secondary | ICD-10-CM | POA: Insufficient documentation

## 2019-09-25 DIAGNOSIS — K2100 Gastro-esophageal reflux disease with esophagitis, without bleeding: Secondary | ICD-10-CM | POA: Diagnosis not present

## 2019-09-25 DIAGNOSIS — K635 Polyp of colon: Secondary | ICD-10-CM | POA: Diagnosis not present

## 2019-09-25 DIAGNOSIS — E559 Vitamin D deficiency, unspecified: Secondary | ICD-10-CM | POA: Insufficient documentation

## 2019-09-25 DIAGNOSIS — D122 Benign neoplasm of ascending colon: Secondary | ICD-10-CM | POA: Insufficient documentation

## 2019-09-25 DIAGNOSIS — Z888 Allergy status to other drugs, medicaments and biological substances status: Secondary | ICD-10-CM | POA: Insufficient documentation

## 2019-09-25 DIAGNOSIS — Z9049 Acquired absence of other specified parts of digestive tract: Secondary | ICD-10-CM | POA: Insufficient documentation

## 2019-09-25 DIAGNOSIS — Z79899 Other long term (current) drug therapy: Secondary | ICD-10-CM | POA: Insufficient documentation

## 2019-09-25 DIAGNOSIS — K921 Melena: Secondary | ICD-10-CM | POA: Insufficient documentation

## 2019-09-25 DIAGNOSIS — D649 Anemia, unspecified: Secondary | ICD-10-CM | POA: Insufficient documentation

## 2019-09-25 DIAGNOSIS — E7800 Pure hypercholesterolemia, unspecified: Secondary | ICD-10-CM | POA: Diagnosis not present

## 2019-09-25 DIAGNOSIS — Z9841 Cataract extraction status, right eye: Secondary | ICD-10-CM | POA: Insufficient documentation

## 2019-09-25 DIAGNOSIS — Z87891 Personal history of nicotine dependence: Secondary | ICD-10-CM | POA: Diagnosis not present

## 2019-09-25 DIAGNOSIS — K2970 Gastritis, unspecified, without bleeding: Secondary | ICD-10-CM | POA: Insufficient documentation

## 2019-09-25 HISTORY — DX: Vitamin D deficiency, unspecified: E55.9

## 2019-09-25 HISTORY — PX: ESOPHAGOGASTRODUODENOSCOPY (EGD) WITH PROPOFOL: SHX5813

## 2019-09-25 HISTORY — PX: COLONOSCOPY WITH PROPOFOL: SHX5780

## 2019-09-25 SURGERY — COLONOSCOPY WITH PROPOFOL
Anesthesia: General

## 2019-09-25 MED ORDER — LIDOCAINE 2% (20 MG/ML) 5 ML SYRINGE
INTRAMUSCULAR | Status: DC | PRN
  Administered 2019-09-25: 50 mg via INTRAVENOUS

## 2019-09-25 MED ORDER — PHENYLEPHRINE HCL (PRESSORS) 10 MG/ML IV SOLN
INTRAVENOUS | Status: DC | PRN
  Administered 2019-09-25: 50 ug via INTRAVENOUS
  Administered 2019-09-25: 100 ug via INTRAVENOUS

## 2019-09-25 MED ORDER — GLYCOPYRROLATE 0.2 MG/ML IJ SOLN
INTRAMUSCULAR | Status: DC | PRN
  Administered 2019-09-25: .2 mg via INTRAVENOUS

## 2019-09-25 MED ORDER — PROPOFOL 10 MG/ML IV BOLUS
INTRAVENOUS | Status: DC | PRN
  Administered 2019-09-25: 40 mg via INTRAVENOUS

## 2019-09-25 MED ORDER — EPHEDRINE SULFATE 50 MG/ML IJ SOLN
INTRAMUSCULAR | Status: DC | PRN
  Administered 2019-09-25: 5 mg via INTRAVENOUS

## 2019-09-25 MED ORDER — LIDOCAINE HCL (PF) 2 % IJ SOLN
INTRAMUSCULAR | Status: AC
  Filled 2019-09-25: qty 10

## 2019-09-25 MED ORDER — GLYCOPYRROLATE 0.2 MG/ML IJ SOLN
INTRAMUSCULAR | Status: AC
  Filled 2019-09-25: qty 1

## 2019-09-25 MED ORDER — SODIUM CHLORIDE 0.9 % IV SOLN
INTRAVENOUS | Status: DC
  Administered 2019-09-25: 1000 mL via INTRAVENOUS

## 2019-09-25 MED ORDER — PROPOFOL 500 MG/50ML IV EMUL
INTRAVENOUS | Status: AC
  Filled 2019-09-25: qty 50

## 2019-09-25 MED ORDER — PROPOFOL 500 MG/50ML IV EMUL
INTRAVENOUS | Status: DC | PRN
  Administered 2019-09-25: 150 ug/kg/min via INTRAVENOUS

## 2019-09-25 NOTE — Op Note (Signed)
[facility] Patient Name: Sandra [name] Procedure Date: 09/25/2019 6:53 AM MRN: [id number] Account #: [id number] Date of Birth: [birth year] Admit Type: Outpatient Age: 79 Room: [facility] ENDO ROOM 1 Gender: Female Note Status: Finalized Procedure:             Colonoscopy Indications:           Heme positive stool Providers:             Robert Bellow, MD Medicines:             Monitored Anesthesia Care Complications:         No immediate complications. Procedure:             Pre-Anesthesia Assessment:                        - Prior to the procedure, a History and Physical was                         performed, and patient medications, allergies and                         sensitivities were reviewed. The patient's tolerance                         of previous anesthesia was reviewed.                        - The risks and benefits of the procedure and the                         sedation options and risks were discussed with the                         patient. All questions were answered and informed                         consent was obtained.                        After obtaining informed consent, the colonoscope was                         passed under direct vision. Throughout the procedure,                         the patient's blood pressure, pulse, and oxygen                         saturations were monitored continuously. The was                         introduced through the anus and advanced to the the                         cecum, identified by appendiceal orifice and ileocecal                         valve. The colonoscopy was somewhat difficult due to  multiple diverticula in the colon. Successful                         completion of the procedure was aided by using manual                         pressure. The patient tolerated the procedure well.                         The quality of the bowel  preparation was excellent. Findings:      A 6 mm polyp was found in the ascending colon. The polyp was sessile.       Biopsies were taken with a cold forceps for histology. Biopsies were       taken with a cold forceps for histology.      A 10 mm polyp was found in the sigmoid colon. The polyp was sessile.       Biopsies were taken with a cold forceps for histology.      Multiple small and large-mouthed diverticula were found in the sigmoid       colon, descending colon, hepatic flexure and ascending colon.      The retroflexed view of the distal rectum and anal verge was normal and       showed no anal or rectal abnormalities.      There was a medium-sized lipoma, 15 mm in diameter, in the sigmoid colon. Impression:            - One 6 mm polyp in the ascending colon. Biopsied.                        - One 10 mm polyp in the sigmoid colon. Biopsied.                        - Diverticulosis in the sigmoid colon, in the                         descending colon, at the hepatic flexure and in the                         ascending colon.                        - The distal rectum and anal verge are normal on                         retroflexion view. Recommendation:        - Telephone endoscopist for pathology results in 1                         week. Procedure Code(s):     --- Professional ---                        [phone], Colonoscopy, flexible; with biopsy, single or                         multiple Diagnosis Code(s):     --- Professional ---  K63.5, Polyp of colon                        R19.5, Other fecal abnormalities                        K57.30, Diverticulosis of large intestine without                         perforation or abscess without bleeding CPT copyright 2019 American Medical Association. All rights reserved. The codes documented in this report are preliminary and upon coder review may  be revised to meet current compliance requirements. Robert Bellow, MD 09/25/2019 8:32:04 AM This report has been signed electronically. Number of Addenda: 0 Note Initiated On: 09/25/2019 6:53 AM Scope Withdrawal Time: 0 hours 21 minutes 8 seconds  Total Procedure Duration: 0 hours 31 minutes 14 seconds  Estimated Blood Loss:  Estimated blood loss: none.      [facility]

## 2019-09-25 NOTE — Anesthesia Preprocedure Evaluation (Signed)
Anesthesia Evaluation  Patient identified by MRN, date of birth, ID band Patient awake    Reviewed: Allergy & Precautions, NPO status , Patient's Chart, lab work & pertinent test results  History of Anesthesia Complications Negative for: history of anesthetic complications  Airway Mallampati: II       Dental  (+) Upper Dentures, Lower Dentures   Pulmonary neg sleep apnea, neg COPD, former smoker,           Cardiovascular hypertension, Pt. on medications (-) Past MI and (-) CHF (-) dysrhythmias (-) Valvular Problems/Murmurs     Neuro/Psych neg Seizures    GI/Hepatic neg GERD  ,Primary biliary cirrhosis   Endo/Other  neg diabetes  Renal/GU negative Renal ROS     Musculoskeletal   Abdominal   Peds  Hematology  (+) anemia ,   Anesthesia Other Findings   Reproductive/Obstetrics                             Anesthesia Physical Anesthesia Plan  ASA: III  Anesthesia Plan: General   Post-op Pain Management:    Induction: Intravenous  PONV Risk Score and Plan: 3 and Propofol infusion, TIVA and Treatment may vary due to age or medical condition  Airway Management Planned: Nasal Cannula  Additional Equipment:   Intra-op Plan:   Post-operative Plan:   Informed Consent: I have reviewed the patients History and Physical, chart, labs and discussed the procedure including the risks, benefits and alternatives for the proposed anesthesia with the patient or authorized representative who has indicated his/her understanding and acceptance.       Plan Discussed with:   Anesthesia Plan Comments:         Anesthesia Quick Evaluation

## 2019-09-25 NOTE — Anesthesia Procedure Notes (Signed)
Date/Time: 09/25/2019 7:39 AM Performed by: Johnna Acosta, CRNA Pre-anesthesia Checklist: Patient identified, Emergency Drugs available, Suction available, Patient being monitored and Timeout performed Patient Re-evaluated:Patient Re-evaluated prior to induction Oxygen Delivery Method: Nasal cannula Preoxygenation: Pre-oxygenation with 100% oxygen

## 2019-09-25 NOTE — Op Note (Signed)
[facility]astroenterology Patient Name: Sandra [name] Procedure Date: 09/25/2019 6:54 AM MRN: [id number] Account #: [id number] Date of Birth: 11/01/1940 Admit Type: Outpatient Age: 79 Room: [facility] ENDO ROOM 1 Gender: Female Note Status: Finalized Procedure:             Upper GI endoscopy Indications:           Heme positive stool Providers:             Robert Bellow, MD Medicines:             Monitored Anesthesia Care Complications:         No immediate complications. Procedure:             Pre-Anesthesia Assessment:                        - Prior to the procedure, a History and Physical was                         performed, and patient medications, allergies and                         sensitivities were reviewed. The patient's tolerance                         of previous anesthesia was reviewed.                        - The risks and benefits of the procedure and the                         sedation options and risks were discussed with the                         patient. All questions were answered and informed                         consent was obtained.                        After obtaining informed consent, the endoscope was                         passed under direct vision. Throughout the procedure,                         the patient's blood pressure, pulse, and oxygen                         saturations were monitored continuously. The Endoscope                         was introduced through the mouth, and advanced to the                         second part of duodenum. The upper GI endoscopy was                         accomplished without difficulty. The patient tolerated  the procedure well. Findings:      LA Grade D (one or more mucosal breaks involving at least 75% of       esophageal circumference) esophagitis with no bleeding was found 35 to       39 cm from the incisors. Biopsies were taken with a cold  forceps for       histology.      Diffuse mild inflammation was found in the prepyloric region of the       stomach.      The stomach was normal. Impression:            - LA Grade D reflux esophagitis with no bleeding. Rule                         out Barrett's esophagus. Biopsied.                        - Gastritis.                        - Normal stomach. Recommendation:        - Perform a colonoscopy today. Procedure Code(s):     --- Professional ---                        [phone], Esophagogastroduodenoscopy, flexible,                         transoral; with biopsy, single or multiple Diagnosis Code(s):     --- Professional ---                        R19.5, Other fecal abnormalities                        K29.70, Gastritis, unspecified, without bleeding                        K21.00 CPT copyright 2019 American Medical Association. All rights reserved. The codes documented in this report are preliminary and upon coder review may  be revised to meet current compliance requirements. Robert Bellow, MD 09/25/2019 7:53:21 AM This report has been signed electronically. Number of Addenda: 0 Note Initiated On: 09/25/2019 6:54 AM Estimated Blood Loss:  Estimated blood loss: none.      [facility]

## 2019-09-25 NOTE — Transfer of Care (Signed)
Immediate Anesthesia Transfer of Care Note  Patient: Sandra [name]  Procedure(s) Performed: COLONOSCOPY WITH PROPOFOL (N/A ) ESOPHAGOGASTRODUODENOSCOPY (EGD) WITH PROPOFOL (N/A )  Patient Location: PACU  Anesthesia Type:General  Level of Consciousness: awake  Airway & Oxygen Therapy: Patient connected to nasal cannula oxygen  Post-op Assessment: Post -op Vital signs reviewed and stable  Post vital signs: stable  Last Vitals:  Vitals Value Taken Time  BP 97/56 09/25/19 0836  Temp 36.1 C 09/25/19 0831  Pulse 74 09/25/19 0836  Resp 21 09/25/19 0836  SpO2 95 % 09/25/19 0836  Vitals shown include unvalidated device data.  Last Pain:  Vitals:   09/25/19 0831  TempSrc: Temporal  PainSc: Asleep         Complications: No apparent anesthesia complications

## 2019-09-25 NOTE — H&P (Signed)
Sandra [name] [id number] July 21, 1941     HPI:  79 y/o woman noted with heme positive stools x 2 in September.  Admitted for diagnostic endoscopoy.  Tolerated prep well.   Medications Prior to Admission  Medication Sig Dispense Refill Last Dose  . aspirin EC 81 MG tablet Take 81 mg by mouth daily.   Past Week at Unknown time  . Calcium Carbonate-Vitamin D (CALTRATE 600+D PO) Take by mouth.   Past Week at Unknown time  . CHOLECALCIFEROL PO Take 1,000 Units by mouth daily.   Past Week at Unknown time  . etanercept (ENBREL) 50 MG/ML injection Inject 50 mg into the skin once a week.   Past Week at Unknown time  . metoprolol tartrate (LOPRESSOR) 25 MG tablet Take 12.5 mg by mouth 2 (two) times daily.    09/25/2019 at 0600  . pravastatin (PRAVACHOL) 20 MG tablet Take 20 mg by mouth daily.   Past Week at Unknown time  . predniSONE (DELTASONE) 2.5 MG tablet Take 2.5 mg by mouth daily with breakfast.   Past Week at Unknown time  . risedronate (ACTONEL) 150 MG tablet Take 150 mg by mouth every 30 (thirty) days. with water on empty stomach, nothing by mouth or lie down for next 30 minutes.   Past Week at Unknown time  . ursodiol (ACTIGALL) 250 MG tablet Take 500 mg by mouth 2 (two) times daily.   Past Week at Unknown time  . ibandronate (BONIVA) 150 MG tablet Take 150 mg by mouth every 30 (thirty) days. Take in the morning with a full glass of water, on an empty stomach, and do not take anything else by mouth or lie down for the next 30 min.      Allergies  Allergen Reactions  . Simvastatin     Pt unsure of reaction, "didn't agree with me"   Past Medical History:  Diagnosis Date  . Anemia   . Hypercholesterolemia   . Hypertension   . Primary biliary cirrhosis ([place])   . Rheumatoid arthritis ([place])   . Vitamin D deficiency    Past Surgical History:  Procedure Laterality Date  . CATARACT EXTRACTION W/PHACO Left 07/20/2019   Procedure: CATARACT EXTRACTION PHACO AND INTRAOCULAR LENS PLACEMENT  (IOC) LEFT 01:15.7        13.3%       10.36;  Surgeon: Eulogio Bear, MD;  Location: [place];  Service: Ophthalmology;  Laterality: Left;  . CATARACT EXTRACTION W/PHACO Right 08/10/2019   Procedure: CATARACT EXTRACTION PHACO AND INTRAOCULAR LENS PLACEMENT (IOC) RIGHT 7.42  00:44.3;  Surgeon: Eulogio Bear, MD;  Location: [place];  Service: Ophthalmology;  Laterality: Right;  . CHOLECYSTECTOMY  2018  . LIVER BIOPSY    . NASAL POLYP EXCISION  2013  . TONSILLECTOMY  1962   Social History   Socioeconomic History  . Marital status: Married    Spouse name: Not on file  . Number of children: Not on file  . Years of education: Not on file  . Highest education level: Not on file  Occupational History  . Not on file  Tobacco Use  . Smoking status: Former Smoker    Quit date: 2013    Years since quitting: 8.0  . Smokeless tobacco: Never Used  Substance and Sexual Activity  . Alcohol use: Not on file  . Drug use: Not on file  . Sexual activity: Not on file  Other Topics Concern  . Not on file  Social  History Narrative  . Not on file   Social Determinants of Health   Financial Resource Strain:   . Difficulty of Paying Living Expenses: Not on file  Food Insecurity:   . Worried About Charity fundraiser in the Last Year: Not on file  . Ran Out of Food in the Last Year: Not on file  Transportation Needs:   . Lack of Transportation (Medical): Not on file  . Lack of Transportation (Non-Medical): Not on file  Physical Activity:   . Days of Exercise per Week: Not on file  . Minutes of Exercise per Session: Not on file  Stress:   . Feeling of Stress : Not on file  Social Connections:   . Frequency of Communication with Friends and Family: Not on file  . Frequency of Social Gatherings with Friends and Family: Not on file  . Attends Religious Services: Not on file  . Active Member of Clubs or Organizations: Not on file  . Attends Archivist  Meetings: Not on file  . Marital Status: Not on file  Intimate Partner Violence:   . Fear of Current or Ex-Partner: Not on file  . Emotionally Abused: Not on file  . Physically Abused: Not on file  . Sexually Abused: Not on file   Social History   Social History Narrative  . Not on file     ROS: Negative.     PE: HEENT: Negative. Lungs: Clear. Cardio: RR.   Assessment/Plan:  Proceed with planned upper and lower endoscopy.  Forest Gleason [facility] 09/25/2019

## 2019-09-25 NOTE — Anesthesia Postprocedure Evaluation (Signed)
Anesthesia Post Note  Patient: Sandra [name]  Procedure(s) Performed: COLONOSCOPY WITH PROPOFOL (N/A ) ESOPHAGOGASTRODUODENOSCOPY (EGD) WITH PROPOFOL (N/A )  Patient location during evaluation: PACU Anesthesia Type: General Level of consciousness: awake and alert Pain management: pain level controlled Vital Signs Assessment: post-procedure vital signs reviewed and stable Respiratory status: spontaneous breathing and respiratory function stable Cardiovascular status: stable Anesthetic complications: no     Last Vitals:  Vitals:   09/25/19 0841 09/25/19 0851  BP: (!) 95/50 105/60  Pulse: 79 82  Resp: (!) 28 (!) 22  Temp:    SpO2: 95% 94%    Last Pain:  Vitals:   09/25/19 0851  TempSrc:   PainSc: 0-No pain                 Nella Botsford K

## 2019-09-28 ENCOUNTER — Encounter: Payer: Self-pay | Admitting: *Deleted

## 2019-09-28 LAB — SURGICAL PATHOLOGY

## 2019-12-21 ENCOUNTER — Other Ambulatory Visit
Admission: RE | Admit: 2019-12-21 | Discharge: 2019-12-21 | Disposition: A | Discharge status: Home / Self Care | Payer: Medicare PPO | Source: Ambulatory Visit | Attending: General Surgery | Admitting: General Surgery

## 2019-12-21 ENCOUNTER — Other Ambulatory Visit: Payer: Self-pay

## 2019-12-21 DIAGNOSIS — Z01812 Encounter for preprocedural laboratory examination: Secondary | ICD-10-CM | POA: Diagnosis present

## 2019-12-21 DIAGNOSIS — Z20822 Contact with and (suspected) exposure to covid-19: Secondary | ICD-10-CM | POA: Insufficient documentation

## 2019-12-21 LAB — SARS CORONAVIRUS 2 (TAT 6-24 HRS): SARS Coronavirus 2: NEGATIVE

## 2019-12-23 ENCOUNTER — Encounter: Payer: Self-pay | Admitting: General Surgery

## 2019-12-23 ENCOUNTER — Ambulatory Visit
Admission: RE | Admit: 2019-12-23 | Discharge: 2019-12-23 | Disposition: A | Discharge status: Home / Self Care | Payer: Medicare PPO | Attending: General Surgery | Admitting: General Surgery

## 2019-12-23 ENCOUNTER — Ambulatory Visit: Payer: Medicare PPO | Admitting: Certified Registered Nurse Anesthetist

## 2019-12-23 ENCOUNTER — Encounter
Admission: RE | Disposition: A | Discharge status: Home / Self Care | Payer: Self-pay | Source: Home / Self Care | Attending: General Surgery

## 2019-12-23 ENCOUNTER — Other Ambulatory Visit: Payer: Self-pay

## 2019-12-23 DIAGNOSIS — M069 Rheumatoid arthritis, unspecified: Secondary | ICD-10-CM | POA: Insufficient documentation

## 2019-12-23 DIAGNOSIS — Z961 Presence of intraocular lens: Secondary | ICD-10-CM | POA: Diagnosis not present

## 2019-12-23 DIAGNOSIS — K2970 Gastritis, unspecified, without bleeding: Secondary | ICD-10-CM | POA: Insufficient documentation

## 2019-12-23 DIAGNOSIS — Z87891 Personal history of nicotine dependence: Secondary | ICD-10-CM | POA: Insufficient documentation

## 2019-12-23 DIAGNOSIS — Z9842 Cataract extraction status, left eye: Secondary | ICD-10-CM | POA: Diagnosis not present

## 2019-12-23 DIAGNOSIS — I10 Essential (primary) hypertension: Secondary | ICD-10-CM | POA: Diagnosis not present

## 2019-12-23 DIAGNOSIS — Z9841 Cataract extraction status, right eye: Secondary | ICD-10-CM | POA: Insufficient documentation

## 2019-12-23 DIAGNOSIS — Z79899 Other long term (current) drug therapy: Secondary | ICD-10-CM | POA: Insufficient documentation

## 2019-12-23 DIAGNOSIS — K2100 Gastro-esophageal reflux disease with esophagitis, without bleeding: Secondary | ICD-10-CM | POA: Diagnosis present

## 2019-12-23 DIAGNOSIS — E7800 Pure hypercholesterolemia, unspecified: Secondary | ICD-10-CM | POA: Diagnosis not present

## 2019-12-23 DIAGNOSIS — D649 Anemia, unspecified: Secondary | ICD-10-CM | POA: Diagnosis not present

## 2019-12-23 DIAGNOSIS — E559 Vitamin D deficiency, unspecified: Secondary | ICD-10-CM | POA: Insufficient documentation

## 2019-12-23 DIAGNOSIS — Z7952 Long term (current) use of systemic steroids: Secondary | ICD-10-CM | POA: Diagnosis not present

## 2019-12-23 DIAGNOSIS — Z7982 Long term (current) use of aspirin: Secondary | ICD-10-CM | POA: Insufficient documentation

## 2019-12-23 DIAGNOSIS — Z9049 Acquired absence of other specified parts of digestive tract: Secondary | ICD-10-CM | POA: Insufficient documentation

## 2019-12-23 DIAGNOSIS — Z888 Allergy status to other drugs, medicaments and biological substances status: Secondary | ICD-10-CM | POA: Diagnosis not present

## 2019-12-23 HISTORY — PX: ESOPHAGOGASTRODUODENOSCOPY (EGD) WITH PROPOFOL: SHX5813

## 2019-12-23 SURGERY — ESOPHAGOGASTRODUODENOSCOPY (EGD) WITH PROPOFOL
Anesthesia: General

## 2019-12-23 MED ORDER — PROPOFOL 10 MG/ML IV BOLUS
INTRAVENOUS | Status: AC
  Filled 2019-12-23: qty 20

## 2019-12-23 MED ORDER — PROPOFOL 500 MG/50ML IV EMUL: INTRAVENOUS | Status: DC | PRN

## 2019-12-23 MED ORDER — LIDOCAINE HCL (CARDIAC) PF 100 MG/5ML IV SOSY
PREFILLED_SYRINGE | INTRAVENOUS | Status: DC | PRN
  Administered 2019-12-23: 50 mg via INTRAVENOUS

## 2019-12-23 MED ORDER — SODIUM CHLORIDE 0.9 % IV SOLN: INTRAVENOUS | Status: DC

## 2019-12-23 MED ORDER — PROPOFOL 500 MG/50ML IV EMUL
INTRAVENOUS | Status: AC
  Filled 2019-12-23: qty 50

## 2019-12-23 MED ORDER — PROPOFOL 500 MG/50ML IV EMUL
INTRAVENOUS | Status: DC | PRN
  Administered 2019-12-23: 150 ug/kg/min via INTRAVENOUS

## 2019-12-23 NOTE — Anesthesia Postprocedure Evaluation (Signed)
Anesthesia Post Note  Patient: Sandra [name]  Procedure(s) Performed: ESOPHAGOGASTRODUODENOSCOPY (EGD) WITH PROPOFOL (N/A )  Anesthesia Type: General Level of consciousness: awake and alert and oriented Pain management: pain level controlled Vital Signs Assessment: post-procedure vital signs reviewed and stable Respiratory status: spontaneous breathing Cardiovascular status: blood pressure returned to baseline Anesthetic complications: no     Last Vitals:  Vitals:   12/23/19 1020 12/23/19 1030  BP: (!) 101/48 (!) 96/45  Pulse: 60 (!) 57  Resp: 19 17  Temp: (!) 35.9 C   SpO2: 97% 97%    Last Pain:  Vitals:   12/23/19 1030  TempSrc:   PainSc: 0-No pain                 Tifani Dack

## 2019-12-23 NOTE — H&P (Signed)
Sandra [name] [id number] 1941/04/19     HPI:  79 y/o with severe esophagitis at EGD in January 2021 done to assess heme positive stools. Biopsy showed a fragment of atypical mucosa. Follow up study to assess resolution of inflammatory process.   Medications Prior to Admission  Medication Sig Dispense Refill Last Dose  . Calcium Carbonate-Vitamin D (CALTRATE 600+D PO) Take by mouth.   12/22/2019 at 0800  . CHOLECALCIFEROL PO Take 1,000 Units by mouth daily.   12/22/2019 at Unknown time  . etanercept (ENBREL) 50 MG/ML injection Inject 50 mg into the skin once a week.   Past Week at Unknown time  . ibandronate (BONIVA) 150 MG tablet Take 150 mg by mouth every 30 (thirty) days. Take in the morning with a full glass of water, on an empty stomach, and do not take anything else by mouth or lie down for the next 30 min.   Past Month at Unknown time  . metoprolol tartrate (LOPRESSOR) 25 MG tablet Take 12.5 mg by mouth 2 (two) times daily.    12/23/2019 at 0800  . pravastatin (PRAVACHOL) 20 MG tablet Take 20 mg by mouth daily.   12/22/2019 at 0800  . predniSONE (DELTASONE) 2.5 MG tablet Take 2.5 mg by mouth daily with breakfast.   12/22/2019 at 0800  . risedronate (ACTONEL) 150 MG tablet Take 150 mg by mouth every 30 (thirty) days. with water on empty stomach, nothing by mouth or lie down for next 30 minutes.   12/22/2019 at Unknown time  . ursodiol (ACTIGALL) 250 MG tablet Take 500 mg by mouth 2 (two) times daily.   12/22/2019 at 0800  . aspirin EC 81 MG tablet Take 81 mg by mouth daily.   Not Taking at Unknown time   Allergies  Allergen Reactions  . Simvastatin     Pt unsure of reaction, "didn't agree with me"   Past Medical History:  Diagnosis Date  . Anemia   . Hypercholesterolemia   . Hypertension   . Primary biliary cirrhosis ([place])   . Rheumatoid arthritis ([place])   . Vitamin D deficiency    Past Surgical History:  Procedure Laterality Date  . CATARACT EXTRACTION W/PHACO Left 07/20/2019   Procedure: CATARACT EXTRACTION PHACO AND INTRAOCULAR LENS PLACEMENT (IOC) LEFT 01:15.7        13.3%       10.36;  Surgeon: Eulogio Bear, MD;  Location: [place];  Service: Ophthalmology;  Laterality: Left;  . CATARACT EXTRACTION W/PHACO Right 08/10/2019   Procedure: CATARACT EXTRACTION PHACO AND INTRAOCULAR LENS PLACEMENT (IOC) RIGHT 7.42  00:44.3;  Surgeon: Eulogio Bear, MD;  Location: [place];  Service: Ophthalmology;  Laterality: Right;  . CHOLECYSTECTOMY  2018  . COLONOSCOPY WITH PROPOFOL N/A 09/25/2019   Procedure: COLONOSCOPY WITH PROPOFOL;  Surgeon: Robert Bellow, MD;  Location: [facility];  Service: Endoscopy;  Laterality: N/A;  . ESOPHAGOGASTRODUODENOSCOPY (EGD) WITH PROPOFOL N/A 09/25/2019   Procedure: ESOPHAGOGASTRODUODENOSCOPY (EGD) WITH PROPOFOL;  Surgeon: Robert Bellow, MD;  Location: [facility];  Service: Endoscopy;  Laterality: N/A;  . LIVER BIOPSY    . NASAL POLYP EXCISION  2013  . TONSILLECTOMY  1962   Social History   Socioeconomic History  . Marital status: Married    Spouse name: Not on file  . Number of children: Not on file  . Years of education: Not on file  . Highest education level: Not on file  Occupational History  . Not on file  Tobacco Use  . Smoking status: Former Smoker    Quit date: 2013    Years since quitting: 8.2  . Smokeless tobacco: Never Used  Substance and Sexual Activity  . Alcohol use: Not Currently  . Drug use: Never  . Sexual activity: Not on file  Other Topics Concern  . Not on file  Social History Narrative  . Not on file   Social Determinants of Health   Financial Resource Strain:   . Difficulty of Paying Living Expenses:   Food Insecurity:   . Worried About Charity fundraiser in the Last Year:   . Arboriculturist in the Last Year:   Transportation Needs:   . Film/video editor (Medical):   Marland Kitchen Lack of Transportation (Non-Medical):   Physical Activity:   . Days of  Exercise per Week:   . Minutes of Exercise per Session:   Stress:   . Feeling of Stress :   Social Connections:   . Frequency of Communication with Friends and Family:   . Frequency of Social Gatherings with Friends and Family:   . Attends Religious Services:   . Active Member of Clubs or Organizations:   . Attends Archivist Meetings:   Marland Kitchen Marital Status:   Intimate Partner Violence:   . Fear of Current or Ex-Partner:   . Emotionally Abused:   Marland Kitchen Physically Abused:   . Sexually Abused:    Social History   Social History Narrative  . Not on file     ROS: Negative.     PE: HEENT: Negative. Lungs: Clear. Cardio: RR.Marland Kitchen  Assessment/Plan:  Proceed with planned endoscopy.   Forest Gleason [facility] 12/23/2019

## 2019-12-23 NOTE — Op Note (Signed)
[facility] Patient Name: Sandra [name] Procedure Date: 12/23/2019 10:03 AM MRN: [id number] Account #: [id number] Date of Birth: September 21, 1940 Admit Type: Outpatient Age: 79 Room: [facility] ENDO ROOM 1 Gender: Female Note Status: Finalized Procedure:             Upper GI endoscopy Indications:           Follow-up of esophageal reflux Providers:             Robert Bellow, MD Referring MD:          Rubbie Battiest. Iona Beard MD, MD (Referring MD) Medicines:             Monitored Anesthesia Care Complications:         No immediate complications. Procedure:             Pre-Anesthesia Assessment:                        - Prior to the procedure, a History and Physical was                         performed, and patient medications, allergies and                         sensitivities were reviewed. The patient's tolerance                         of previous anesthesia was reviewed.                        - The risks and benefits of the procedure and the                         sedation options and risks were discussed with the                         patient. All questions were answered and informed                         consent was obtained.                        After obtaining informed consent, the endoscope was                         passed under direct vision. Throughout the procedure,                         the patient's blood pressure, pulse, and oxygen                         saturations were monitored continuously. The Endoscope                         was introduced through the mouth, and advanced to the                         second part of duodenum. The upper GI endoscopy was  accomplished without difficulty. The patient tolerated                         the procedure well. Findings:      LA Grade C (one or more mucosal breaks continuous between tops of 2 or       more mucosal folds, less than 75% circumference) esophagitis  with no       bleeding was found 36 to 38 cm from the incisors. Biopsies were taken       with a cold forceps for histology.      Scattered mild inflammation characterized by friability was found on the       greater curvature of the stomach.      The examined duodenum was normal. Impression:            - LA Grade C reflux esophagitis with no bleeding.                         Biopsied.                        - Gastritis.                        - Normal examined duodenum. Recommendation:        - Telephone endoscopist for pathology results in 1                         week. Procedure Code(s):     --- Professional ---                        [phone], Esophagogastroduodenoscopy, flexible,                         transoral; with biopsy, single or multiple Diagnosis Code(s):     --- Professional ---                        K21.00                        K29.70, Gastritis, unspecified, without bleeding CPT copyright 2019 American Medical Association. All rights reserved. The codes documented in this report are preliminary and upon coder review may  be revised to meet current compliance requirements. Robert Bellow, MD 12/23/2019 10:18:18 AM This report has been signed electronically. Number of Addenda: 0 Note Initiated On: 12/23/2019 10:03 AM Estimated Blood Loss:  Estimated blood loss: none.      [facility]

## 2019-12-23 NOTE — Transfer of Care (Signed)
Immediate Anesthesia Transfer of Care Note  Patient: Sandra [name]  Procedure(s) Performed: ESOPHAGOGASTRODUODENOSCOPY (EGD) WITH PROPOFOL (N/A )  Patient Location: PACU  Anesthesia Type:General  Level of Consciousness: awake, alert  and oriented  Airway & Oxygen Therapy: Patient Spontanous Breathing  Post-op Assessment: Report given to RN and Post -op Vital signs reviewed and stable  Post vital signs: Reviewed and stable  Last Vitals:  Vitals Value Taken Time  BP    Temp    Pulse 59 12/23/19 1019  Resp 19 12/23/19 1019  SpO2 97 % 12/23/19 1019  Vitals shown include unvalidated device data.  Last Pain:  Vitals:   12/23/19 0903  TempSrc: Temporal         Complications: No apparent anesthesia complications

## 2019-12-23 NOTE — Progress Notes (Signed)
Repeat EGD shows improvement in distal esophagus.  Biopsies completed to assess inflammatory process.

## 2019-12-23 NOTE — Anesthesia Preprocedure Evaluation (Signed)
Anesthesia Evaluation  Patient identified by MRN, date of birth, ID band Patient awake    Reviewed: Allergy & Precautions, NPO status , Patient's Chart, lab work & pertinent test results  History of Anesthesia Complications Negative for: history of anesthetic complications  Airway Mallampati: II       Dental  (+) Upper Dentures, Lower Dentures   Pulmonary neg sleep apnea, neg COPD, former smoker,           Cardiovascular hypertension, Pt. on medications (-) Past MI and (-) CHF (-) dysrhythmias (-) Valvular Problems/Murmurs     Neuro/Psych neg Seizures negative neurological ROS  negative psych ROS   GI/Hepatic Neg liver ROS, neg GERD  ,Primary biliary cirrhosis   Endo/Other  neg diabetes  Renal/GU negative Renal ROS     Musculoskeletal  (+) Arthritis , Osteoarthritis,    Abdominal   Peds negative pediatric ROS (+)  Hematology  (+) anemia ,   Anesthesia Other Findings Past Medical History: No date: Anemia No date: Hypercholesterolemia No date: Hypertension No date: Primary biliary cirrhosis (HCC) No date: Rheumatoid arthritis ([place]) No date: Vitamin D deficiency  Reproductive/Obstetrics                             Anesthesia Physical  Anesthesia Plan  ASA: III  Anesthesia Plan: General   Post-op Pain Management:    Induction: Intravenous  PONV Risk Score and Plan: 3 and Propofol infusion, TIVA and Treatment may vary due to age or medical condition  Airway Management Planned: Nasal Cannula  Additional Equipment:   Intra-op Plan:   Post-operative Plan:   Informed Consent: I have reviewed the patients History and Physical, chart, labs and discussed the procedure including the risks, benefits and alternatives for the proposed anesthesia with the patient or authorized representative who has indicated his/her understanding and acceptance.       Plan Discussed with:    Anesthesia Plan Comments:         Anesthesia Quick Evaluation

## 2019-12-24 ENCOUNTER — Encounter: Payer: Self-pay | Admitting: *Deleted

## 2019-12-28 LAB — SURGICAL PATHOLOGY

## 2020-10-03 ENCOUNTER — Other Ambulatory Visit (HOSPITAL_COMMUNITY): Payer: Self-pay | Admitting: Gastroenterology

## 2020-10-03 DIAGNOSIS — D509 Iron deficiency anemia, unspecified: Secondary | ICD-10-CM

## 2020-10-03 DIAGNOSIS — R194 Change in bowel habit: Secondary | ICD-10-CM

## 2020-10-03 DIAGNOSIS — R1031 Right lower quadrant pain: Secondary | ICD-10-CM

## 2020-10-19 ENCOUNTER — Ambulatory Visit
Admission: RE | Admit: 2020-10-19 | Discharge: 2020-10-19 | Disposition: A | Discharge status: Home / Self Care | Payer: Medicare PPO | Source: Ambulatory Visit | Attending: Gastroenterology | Admitting: Gastroenterology

## 2020-10-19 ENCOUNTER — Other Ambulatory Visit: Payer: Self-pay

## 2020-10-19 DIAGNOSIS — R1031 Right lower quadrant pain: Secondary | ICD-10-CM | POA: Insufficient documentation

## 2020-10-19 DIAGNOSIS — R1032 Left lower quadrant pain: Secondary | ICD-10-CM | POA: Insufficient documentation

## 2020-10-19 DIAGNOSIS — R194 Change in bowel habit: Secondary | ICD-10-CM | POA: Insufficient documentation

## 2020-10-19 DIAGNOSIS — D509 Iron deficiency anemia, unspecified: Secondary | ICD-10-CM | POA: Diagnosis present

## 2020-10-19 LAB — POCT I-STAT CREATININE: Creatinine, Ser: 0.9 mg/dL (ref 0.44–1.00)

## 2020-10-19 MED ORDER — IOHEXOL 300 MG/ML  SOLN
100.0000 mL | Freq: Once | INTRAMUSCULAR | Status: AC | PRN
  Administered 2020-10-19: 100 mL via INTRAVENOUS

## 2021-03-15 ENCOUNTER — Other Ambulatory Visit (HOSPITAL_COMMUNITY): Payer: Self-pay | Admitting: Gastroenterology

## 2021-03-15 ENCOUNTER — Other Ambulatory Visit: Payer: Self-pay | Admitting: Gastroenterology

## 2021-03-15 DIAGNOSIS — K769 Liver disease, unspecified: Secondary | ICD-10-CM

## 2021-03-23 ENCOUNTER — Ambulatory Visit: Payer: Medicare PPO

## 2021-03-27 ENCOUNTER — Other Ambulatory Visit: Payer: Self-pay

## 2021-03-27 ENCOUNTER — Ambulatory Visit
Admission: RE | Admit: 2021-03-27 | Discharge: 2021-03-27 | Disposition: A | Discharge status: Home / Self Care | Payer: Medicare PPO | Source: Ambulatory Visit | Attending: Gastroenterology | Admitting: Gastroenterology

## 2021-03-27 DIAGNOSIS — K769 Liver disease, unspecified: Secondary | ICD-10-CM | POA: Diagnosis present

## 2021-03-27 LAB — POCT I-STAT CREATININE: Creatinine, Ser: 1 mg/dL (ref 0.44–1.00)

## 2021-03-27 MED ORDER — IOHEXOL 350 MG/ML SOLN
80.0000 mL | Freq: Once | INTRAVENOUS | Status: AC | PRN
  Administered 2021-03-27: 80 mL via INTRAVENOUS

## 2022-07-30 ENCOUNTER — Other Ambulatory Visit: Payer: Self-pay | Admitting: Family Medicine

## 2022-07-30 DIAGNOSIS — Z1231 Encounter for screening mammogram for malignant neoplasm of breast: Secondary | ICD-10-CM

## 2022-09-18 ENCOUNTER — Ambulatory Visit: Payer: Medicare PPO

## 2022-10-22 ENCOUNTER — Ambulatory Visit: Payer: Medicare PPO

## 2022-11-26 ENCOUNTER — Ambulatory Visit: Payer: Medicare PPO

## 2023-05-28 ENCOUNTER — Encounter: Payer: Self-pay | Admitting: Internal Medicine

## 2023-05-28 ENCOUNTER — Inpatient Hospital Stay: Payer: Medicare PPO | Attending: Internal Medicine | Admitting: Internal Medicine

## 2023-05-28 ENCOUNTER — Inpatient Hospital Stay: Payer: Medicare PPO

## 2023-05-28 VITALS — BP 160/66 | HR 56 | Temp 97.9°F | Ht 65.0 in | Wt 153.4 lb

## 2023-05-28 DIAGNOSIS — Z87891 Personal history of nicotine dependence: Secondary | ICD-10-CM | POA: Insufficient documentation

## 2023-05-28 DIAGNOSIS — R5382 Chronic fatigue, unspecified: Secondary | ICD-10-CM | POA: Diagnosis not present

## 2023-05-28 DIAGNOSIS — M898X9 Other specified disorders of bone, unspecified site: Secondary | ICD-10-CM | POA: Insufficient documentation

## 2023-05-28 DIAGNOSIS — D472 Monoclonal gammopathy: Secondary | ICD-10-CM | POA: Insufficient documentation

## 2023-05-28 DIAGNOSIS — D509 Iron deficiency anemia, unspecified: Secondary | ICD-10-CM | POA: Insufficient documentation

## 2023-05-28 DIAGNOSIS — E559 Vitamin D deficiency, unspecified: Secondary | ICD-10-CM | POA: Diagnosis not present

## 2023-05-28 DIAGNOSIS — Z808 Family history of malignant neoplasm of other organs or systems: Secondary | ICD-10-CM | POA: Diagnosis not present

## 2023-05-28 DIAGNOSIS — M069 Rheumatoid arthritis, unspecified: Secondary | ICD-10-CM | POA: Insufficient documentation

## 2023-05-28 DIAGNOSIS — N189 Chronic kidney disease, unspecified: Secondary | ICD-10-CM | POA: Insufficient documentation

## 2023-05-28 DIAGNOSIS — Z79899 Other long term (current) drug therapy: Secondary | ICD-10-CM | POA: Diagnosis not present

## 2023-05-28 DIAGNOSIS — I129 Hypertensive chronic kidney disease with stage 1 through stage 4 chronic kidney disease, or unspecified chronic kidney disease: Secondary | ICD-10-CM | POA: Insufficient documentation

## 2023-05-28 LAB — CBC WITH DIFFERENTIAL/PLATELET
Abs Immature Granulocytes: 0.09 10*3/uL — ABNORMAL HIGH (ref 0.00–0.07)
Basophils Absolute: 0 10*3/uL (ref 0.0–0.1)
Basophils Relative: 0 %
Eosinophils Absolute: 0 10*3/uL (ref 0.0–0.5)
Eosinophils Relative: 0 %
HCT: 37.1 % (ref 36.0–46.0)
Hemoglobin: 11.5 g/dL — ABNORMAL LOW (ref 12.0–15.0)
Immature Granulocytes: 1 %
Lymphocytes Relative: 14 %
Lymphs Abs: 1.6 10*3/uL (ref 0.7–4.0)
MCH: 27.6 pg (ref 26.0–34.0)
MCHC: 31 g/dL (ref 30.0–36.0)
MCV: 89.2 fL (ref 80.0–100.0)
Monocytes Absolute: 0.2 10*3/uL (ref 0.1–1.0)
Monocytes Relative: 2 %
Neutro Abs: 10 10*3/uL — ABNORMAL HIGH (ref 1.7–7.7)
Neutrophils Relative %: 83 %
Platelets: 233 10*3/uL (ref 150–400)
RBC: 4.16 MIL/uL (ref 3.87–5.11)
RDW: 14.9 % (ref 11.5–15.5)
WBC: 11.9 10*3/uL — ABNORMAL HIGH (ref 4.0–10.5)
nRBC: 0 % (ref 0.0–0.2)

## 2023-05-28 LAB — COMPREHENSIVE METABOLIC PANEL
ALT: 17 U/L (ref 0–44)
AST: 19 U/L (ref 15–41)
Albumin: 3.7 g/dL (ref 3.5–5.0)
Alkaline Phosphatase: 63 U/L (ref 38–126)
Anion gap: 7 (ref 5–15)
BUN: 28 mg/dL — ABNORMAL HIGH (ref 8–23)
CO2: 26 mmol/L (ref 22–32)
Calcium: 9 mg/dL (ref 8.9–10.3)
Chloride: 103 mmol/L (ref 98–111)
Creatinine, Ser: 1.08 mg/dL — ABNORMAL HIGH (ref 0.44–1.00)
GFR, Estimated: 52 mL/min — ABNORMAL LOW (ref >60)
Glucose, Bld: 117 mg/dL — ABNORMAL HIGH (ref 70–99)
Potassium: 3.9 mmol/L (ref 3.5–5.1)
Sodium: 136 mmol/L (ref 135–145)
Total Bilirubin: 0.4 mg/dL (ref 0.3–1.2)
Total Protein: 7 g/dL (ref 6.5–8.1)

## 2023-05-28 LAB — LACTATE DEHYDROGENASE: LDH: 167 U/L (ref 98–192)

## 2023-05-28 LAB — FERRITIN: Ferritin: 25 ng/mL (ref 11–307)

## 2023-05-28 LAB — IRON AND TIBC
Iron: 44 ug/dL (ref 28–170)
Saturation Ratios: 10 % — ABNORMAL LOW (ref 10.4–31.8)
TIBC: 441 ug/dL (ref 250–450)
UIBC: 397 ug/dL

## 2023-05-28 LAB — RETICULOCYTES
Immature Retic Fract: 24.2 % — ABNORMAL HIGH (ref 2.3–15.9)
RBC.: 4.12 MIL/uL (ref 3.87–5.11)
Retic Count, Absolute: 114.5 10*3/uL (ref 19.0–186.0)
Retic Ct Pct: 2.8 % (ref 0.4–3.1)

## 2023-05-28 NOTE — Assessment & Plan Note (Addendum)
#    IgG-kappa. 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   # No history of any worsening anemia or renal insufficiency or hypercalcemia [see below regarding anemia]-I had a-long discussion with the patient regarding natural history of MGUS; small risk of progression to multiple myeloma. Patient is less likely at this time patient has any active myeloma-although given  anemia [see discussion below]. Check MM panel; cbc/cmp; K/l light chains.  I also discussed the role of bone marrow biopsy for further characterization-risk stratification etc.  However I think is reasonable to hold off any further invasive procedure at this time-unless there is a rapid progression of disease.   # History of anemia [10-11]-iron deficiency/GI [primary biliary cirrhosis]-followed by [facility].  Check iron studies ferritin.  # Rheumatoid Arthritis/OA- prednisone 2.5 mg/actemra [Dr.Doss; [facility]]- on TNF blockers- stable.   Thank you Dr.George for allowing me to participate in the care of your pleasant patient. Please do not hesitate to contact me with questions or concerns in the interim.   # DISPOSITION: # labs today ordered # follow up- TBD- Dr.B

## 2023-05-28 NOTE — Progress Notes (Signed)
Takes prednisone 2.5mg  daily, also taking a taper as well for 20 days.  Actemra infusion 100 ml, every month.

## 2023-05-28 NOTE — Progress Notes (Signed)
[facility] CONSULT NOTE  Patient Care Team: Rayetta Humphrey, MD as PCP - General (Family Medicine) Earna Coder, MD as Consulting Physician (Oncology)  CHIEF COMPLAINTS/PURPOSE OF CONSULTATION: Monoclonal gammopathy  HEMATOLOGY HISTORY  . Monoclonal gammopathy of uncertain significance (MGUS), IgG-kappa a. Feb 2015: Work up for anemia (Hgb 9.9); SPEP with no M-spike; IgG kappa detected on serum IFE only; normal creatinine and calcium, albumin 3.1 b. March 2015: IgG kappa detected on IFE only, no M-spike, kappa 6.05 mg/dL, lambda 2.70 mg/dL, ratio 6.23; Hgb 10.8, negative skeletal survey  2. Iron-deficiency anemia 3. Hyperlipidemia 4. HTN 5. Vitamin D deficiency   # CKD stage- [Dr.]  HISTORY OF PRESENTING ILLNESS: Patient ambulating-independently. Alone/Accompanied by daughter  Sandra [name] 82 y.o.  female has been referred to [place] for further evaluation/work-up for monoclonal gammopathy.  Patient noted to have elevated M protein as part of workup of anemia of hemoglobin 9.9 in 2015.  Patient was being followed by [facility] on a yearly basis.  However as patient's husband got sick/and also because of COVID patient unable to travel to [facility] for further follow-ups.  Of note patient brother recently progressed to multiple myeloma triggering anxiety/and a follow-up with [place].  Patient last seen at [facility] in 2020.  Patient has chronic rheumatoid arthritis/chronic bone pain.  Not any worse.  Chronic mild fatigue.  Denies any tingling or numbness in extremities.  Review of Systems  Constitutional:  Positive for malaise/fatigue. Negative for chills, diaphoresis, fever and weight loss.  HENT:  Negative for nosebleeds and sore throat.   Eyes:  Negative for double vision.  Respiratory:  Negative for cough, hemoptysis, sputum production, shortness of breath and wheezing.   Cardiovascular:  Positive for leg swelling. Negative for chest pain, palpitations and orthopnea.   Gastrointestinal:  Negative for abdominal pain, blood in stool, constipation, diarrhea, heartburn, melena, nausea and vomiting.  Genitourinary:  Negative for dysuria, frequency and urgency.  Musculoskeletal:  Positive for back pain and joint pain.  Skin: Negative.  Negative for itching and rash.  Neurological:  Negative for dizziness, tingling, focal weakness, weakness and headaches.  Endo/Heme/Allergies:  Does not bruise/bleed easily.  Psychiatric/Behavioral:  Negative for depression. The patient is not nervous/anxious and does not have insomnia.     MEDICAL HISTORY:  Past Medical History:  Diagnosis Date   Anemia    Hypercholesterolemia    Hypertension    Primary biliary cirrhosis (HCC)    Rheumatoid arthritis (HCC)    Vitamin D deficiency     SURGICAL HISTORY: Past Surgical History:  Procedure Laterality Date   CATARACT EXTRACTION W/PHACO Left 07/20/2019   Procedure: CATARACT EXTRACTION PHACO AND INTRAOCULAR LENS PLACEMENT (IOC) LEFT 01:15.7        13.3%       10.36;  Surgeon: Nevada Crane, MD;  Location: [facility];  Service: Ophthalmology;  Laterality: Left;   CATARACT EXTRACTION W/PHACO Right 08/10/2019   Procedure: CATARACT EXTRACTION PHACO AND INTRAOCULAR LENS PLACEMENT (IOC) RIGHT 7.42  00:44.3;  Surgeon: Nevada Crane, MD;  Location: [facility];  Service: Ophthalmology;  Laterality: Right;   CHOLECYSTECTOMY  2018   COLONOSCOPY WITH PROPOFOL N/A 09/25/2019   Procedure: COLONOSCOPY WITH PROPOFOL;  Surgeon: Earline Mayotte, MD;  Location: [facility];  Service: Endoscopy;  Laterality: N/A;   ESOPHAGOGASTRODUODENOSCOPY (EGD) WITH PROPOFOL N/A 09/25/2019   Procedure: ESOPHAGOGASTRODUODENOSCOPY (EGD) WITH PROPOFOL;  Surgeon: Earline Mayotte, MD;  Location: [facility];  Service: Endoscopy;  Laterality: N/A;  ESOPHAGOGASTRODUODENOSCOPY (EGD) WITH PROPOFOL N/A 12/23/2019   Procedure: ESOPHAGOGASTRODUODENOSCOPY (EGD) WITH PROPOFOL;  Surgeon:  Earline Mayotte, MD;  Location: [facility] END[facility]COPY;  Service: Endoscopy;  Laterality: N/A;   LIVER BIOPSY     NASAL POLYP EXCISION  2013   TONSILLECTOMY  1962    SOCIAL HISTORY: Social History   Socioeconomic History   Marital status: Married    Spouse name: Not on file   Number of children: Not on file   Years of education: Not on file   Highest education level: Not on file  Occupational History   Not on file  Tobacco Use   Smoking status: Former    Current packs/day: 0.00    Types: Cigarettes    Quit date: 2013    Years since quitting: 11.7   Smokeless tobacco: Never  Vaping Use   Vaping status: Never Used  Substance and Sexual Activity   Alcohol use: Not Currently   Drug use: Never   Sexual activity: Not on file  Other Topics Concern   Not on file  Social History Narrative   Not on file   Social Determinants of Health   Financial Resource Strain: Not on file  Food Insecurity: No Food Insecurity (05/28/2023)   Hunger Vital Sign    Worried About Running Out of Food in the Last Year: Never true    Ran Out of Food in the Last Year: Never true  Transportation Needs: No Transportation Needs (05/28/2023)   PRAPARE - Administrator, Civil Service (Medical): No    Lack of Transportation (Non-Medical): No  Physical Activity: Not on file  Stress: Not on file  Social Connections: Not on file  Intimate Partner Violence: Not At Risk (05/28/2023)   Humiliation, Afraid, Rape, and Kick questionnaire    Fear of Current or Ex-Partner: No    Emotionally Abused: No    Physically Abused: No    Sexually Abused: No    FAMILY HISTORY: Family History  Problem Relation Age of Onset   Rheum arthritis Sister    Throat cancer Brother        Monoclonal gammopathy    ALLERGIES:  is allergic to simvastatin.  MEDICATIONS:  Current Outpatient Medications  Medication Sig Dispense Refill   aspirin EC 81 MG tablet Take 81 mg by mouth daily.     Calcium Carbonate-Vitamin D  (CALTRATE 600+D PO) Take by mouth.     CHOLECALCIFEROL PO Take 1,000 Units by mouth daily.     ibandronate (BONIVA) 150 MG tablet Take 150 mg by mouth every 30 (thirty) days. Take in the morning with a full glass of water, on an empty stomach, and do not take anything else by mouth or lie down for the next 30 min.     metoprolol tartrate (LOPRESSOR) 25 MG tablet Take 25 mg by mouth 2 (two) times daily.     pravastatin (PRAVACHOL) 20 MG tablet Take 20 mg by mouth daily.     predniSONE (DELTASONE) 2.5 MG tablet Take 2.5 mg by mouth daily with breakfast.     risedronate (ACTONEL) 150 MG tablet Take 150 mg by mouth every 30 (thirty) days. with water on empty stomach, nothing by mouth or lie down for next 30 minutes.     ursodiol (ACTIGALL) 250 MG tablet Take 500 mg by mouth 2 (two) times daily.     No current facility-administered medications for this visit.      PHYSICAL EXAMINATION:   Vitals:   05/28/23 1348  05/28/23 1450  BP: (!) 180/80 (!) 160/66  Pulse: (!) 56   Temp: 97.9 F (36.6 C)   SpO2: 97%    Filed Weights   05/28/23 1348  Weight: 153 lb 6.4 oz (69.6 kg)    Physical Exam Vitals and nursing note reviewed.  HENT:     Head: Normocephalic and atraumatic.     Mouth/Throat:     Pharynx: Oropharynx is clear.  Eyes:     Extraocular Movements: Extraocular movements intact.     Pupils: Pupils are equal, round, and reactive to light.  Cardiovascular:     Rate and Rhythm: Normal rate and regular rhythm.  Pulmonary:     Comments: Decreased breath sounds bilaterally.  Abdominal:     Palpations: Abdomen is soft.  Musculoskeletal:        General: Normal range of motion.     Cervical back: Normal range of motion.  Skin:    General: Skin is warm.  Neurological:     General: No focal deficit present.     Mental Status: She is alert and oriented to person, place, and time.  Psychiatric:        Behavior: Behavior normal.        Judgment: Judgment normal.     LABORATORY  DATA:  I have reviewed the data as listed Lab Results  Component Value Date   WBC 10.1 05/25/2012   HGB 12.6 05/25/2012   HCT 37.1 05/25/2012   MCV 84 05/25/2012   PLT 270 05/25/2012   No results for input(s): "NA", "K", "CL", "CO2", "GLUCOSE", "BUN", "CREATININE", "CALCIUM", "GFRNONAA", "GFRAA", "PROT", "ALBUMIN", "AST", "ALT", "ALKPHOS", "BILITOT", "BILIDIR", "IBILI" in the last 8760 hours.   No results found.  No results found for: "KPAFRELGTCHN", "LAMBDASER", "KAPLAMBRATIO"   MGUS (monoclonal gammopathy of unknown significance) #  IgG-kappa. 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   # No history of any worsening anemia or renal insufficiency or hypercalcemia [see below regarding anemia]-I had a-long discussion with the patient regarding natural history of MGUS; small risk of progression to multiple myeloma. Patient is less likely at this time patient has any active myeloma-although given  anemia [see discussion below]. Check MM panel; cbc/cmp; K/l light chains.  I also discussed the role of bone marrow biopsy for further characterization-risk stratification etc.  However I think is reasonable to hold off any further invasive procedure at this time-unless there is a rapid progression of disease.   # History of anemia [10-11]-iron deficiency/GI [primary biliary cirrhosis]-followed by [facility].  Check iron studies ferritin.  # Rheumatoid Arthritis/OA- prednisone 2.5 mg/actemra [Dr.Doss; [facility]]- on TNF blockers- stable.   Thank you Dr.George for allowing me to participate in the care of your pleasant patient. Please do not hesitate to contact me with questions or concerns in the interim.   # DISPOSITION: # labs today ordered # follow up- TBD- Dr.B  All questions were answered. The patient knows to  call the clinic with any problems, questions or concerns.    Earna Coder, MD 05/28/2023 3:05 PM

## 2023-05-29 ENCOUNTER — Telehealth: Payer: Self-pay | Admitting: Internal Medicine

## 2023-05-29 ENCOUNTER — Other Ambulatory Visit: Payer: Self-pay

## 2023-05-29 DIAGNOSIS — D472 Monoclonal gammopathy: Secondary | ICD-10-CM

## 2023-05-29 NOTE — Telephone Encounter (Signed)
Please schedule follow-up in 12 months-MD labs-2 weeks prior-CBC CMP iron studies ferritin; LDH; multiple myeloma panel; kappa lambda light chain ratio- Dr.B

## 2023-05-29 NOTE — Telephone Encounter (Signed)
Labs entered.

## 2023-06-02 LAB — MULTIPLE MYELOMA PANEL, SERUM
Albumin SerPl Elph-Mcnc: 3.7 g/dL (ref 2.9–4.4)
Albumin/Glob SerPl: 1.3 (ref 0.7–1.7)
Alpha 1: 0.2 g/dL (ref 0.0–0.4)
Alpha2 Glob SerPl Elph-Mcnc: 0.6 g/dL (ref 0.4–1.0)
B-Globulin SerPl Elph-Mcnc: 1.4 g/dL — ABNORMAL HIGH (ref 0.7–1.3)
Gamma Glob SerPl Elph-Mcnc: 0.7 g/dL (ref 0.4–1.8)
Globulin, Total: 2.9 g/dL (ref 2.2–3.9)
IgA: 166 mg/dL (ref 64–422)
IgG (Immunoglobin G), Serum: 1160 mg/dL (ref 586–1602)
IgM (Immunoglobulin M), Srm: 57 mg/dL (ref 26–217)
M Protein SerPl Elph-Mcnc: 0.7 g/dL — ABNORMAL HIGH
Total Protein ELP: 6.6 g/dL (ref 6.0–8.5)

## 2024-05-14 ENCOUNTER — Inpatient Hospital Stay: Payer: Medicare PPO | Attending: Internal Medicine

## 2024-05-14 DIAGNOSIS — Z79899 Other long term (current) drug therapy: Secondary | ICD-10-CM | POA: Diagnosis not present

## 2024-05-14 DIAGNOSIS — N183 Chronic kidney disease, stage 3 unspecified: Secondary | ICD-10-CM | POA: Insufficient documentation

## 2024-05-14 DIAGNOSIS — M069 Rheumatoid arthritis, unspecified: Secondary | ICD-10-CM | POA: Insufficient documentation

## 2024-05-14 DIAGNOSIS — D509 Iron deficiency anemia, unspecified: Secondary | ICD-10-CM | POA: Insufficient documentation

## 2024-05-14 DIAGNOSIS — D472 Monoclonal gammopathy: Secondary | ICD-10-CM | POA: Insufficient documentation

## 2024-05-14 LAB — CMP (CANCER CENTER ONLY)
ALT: 14 U/L (ref 0–44)
AST: 18 U/L (ref 15–41)
Albumin: 3.8 g/dL (ref 3.5–5.0)
Alkaline Phosphatase: 54 U/L (ref 38–126)
Anion gap: 9 (ref 5–15)
BUN: 26 mg/dL — ABNORMAL HIGH (ref 8–23)
CO2: 24 mmol/L (ref 22–32)
Calcium: 9.4 mg/dL (ref 8.9–10.3)
Chloride: 105 mmol/L (ref 98–111)
Creatinine: 1.09 mg/dL — ABNORMAL HIGH (ref 0.44–1.00)
GFR, Estimated: 51 mL/min — ABNORMAL LOW (ref >60)
Glucose, Bld: 101 mg/dL — ABNORMAL HIGH (ref 70–99)
Potassium: 3.9 mmol/L (ref 3.5–5.1)
Sodium: 138 mmol/L (ref 135–145)
Total Bilirubin: 0.6 mg/dL (ref 0.0–1.2)
Total Protein: 6.6 g/dL (ref 6.5–8.1)

## 2024-05-14 LAB — CBC WITH DIFFERENTIAL (CANCER CENTER ONLY)
Abs Immature Granulocytes: 0.04 K/uL (ref 0.00–0.07)
Basophils Absolute: 0 K/uL (ref 0.0–0.1)
Basophils Relative: 0 %
Eosinophils Absolute: 0.1 K/uL (ref 0.0–0.5)
Eosinophils Relative: 1 %
HCT: 32.7 % — ABNORMAL LOW (ref 36.0–46.0)
Hemoglobin: 9.9 g/dL — ABNORMAL LOW (ref 12.0–15.0)
Immature Granulocytes: 0 %
Lymphocytes Relative: 20 %
Lymphs Abs: 1.9 K/uL (ref 0.7–4.0)
MCH: 26.3 pg (ref 26.0–34.0)
MCHC: 30.3 g/dL (ref 30.0–36.0)
MCV: 86.7 fL (ref 80.0–100.0)
Monocytes Absolute: 0.5 K/uL (ref 0.1–1.0)
Monocytes Relative: 5 %
Neutro Abs: 6.9 K/uL (ref 1.7–7.7)
Neutrophils Relative %: 74 %
Platelet Count: 232 K/uL (ref 150–400)
RBC: 3.77 MIL/uL — ABNORMAL LOW (ref 3.87–5.11)
RDW: 15.6 % — ABNORMAL HIGH (ref 11.5–15.5)
WBC Count: 9.4 K/uL (ref 4.0–10.5)
nRBC: 0 % (ref 0.0–0.2)

## 2024-05-14 LAB — IRON AND TIBC
Iron: 29 ug/dL (ref 28–170)
Saturation Ratios: 7 % — ABNORMAL LOW (ref 10.4–31.8)
TIBC: 424 ug/dL (ref 250–450)
UIBC: 395 ug/dL

## 2024-05-14 LAB — LACTATE DEHYDROGENASE: LDH: 142 U/L (ref 98–192)

## 2024-05-14 LAB — FERRITIN: Ferritin: 13 ng/mL (ref 11–307)

## 2024-05-15 LAB — KAPPA/LAMBDA LIGHT CHAINS
Kappa free light chain: 166.8 mg/L — ABNORMAL HIGH (ref 3.3–19.4)
Kappa, lambda light chain ratio: 13.56 — ABNORMAL HIGH (ref 0.26–1.65)
Lambda free light chains: 12.3 mg/L (ref 5.7–26.3)

## 2024-05-18 LAB — MULTIPLE MYELOMA PANEL, SERUM
Albumin SerPl Elph-Mcnc: 3.5 g/dL (ref 2.9–4.4)
Albumin/Glob SerPl: 1.3 (ref 0.7–1.7)
Alpha 1: 0.2 g/dL (ref 0.0–0.4)
Alpha2 Glob SerPl Elph-Mcnc: 0.6 g/dL (ref 0.4–1.0)
B-Globulin SerPl Elph-Mcnc: 1.4 g/dL — ABNORMAL HIGH (ref 0.7–1.3)
Gamma Glob SerPl Elph-Mcnc: 0.6 g/dL (ref 0.4–1.8)
Globulin, Total: 2.8 g/dL (ref 2.2–3.9)
IgA: 113 mg/dL (ref 64–422)
IgG (Immunoglobin G), Serum: 1034 mg/dL (ref 586–1602)
IgM (Immunoglobulin M), Srm: 47 mg/dL (ref 26–217)
M Protein SerPl Elph-Mcnc: 0.8 g/dL — ABNORMAL HIGH
Total Protein ELP: 6.3 g/dL (ref 6.0–8.5)

## 2024-05-28 ENCOUNTER — Other Ambulatory Visit: Payer: Self-pay

## 2024-05-28 ENCOUNTER — Encounter: Payer: Self-pay | Admitting: Internal Medicine

## 2024-05-28 ENCOUNTER — Inpatient Hospital Stay: Payer: Medicare PPO | Admitting: Internal Medicine

## 2024-05-28 VITALS — BP 160/70 | HR 70 | Temp 97.6°F | Resp 20 | Ht 65.0 in | Wt 153.0 lb

## 2024-05-28 DIAGNOSIS — D649 Anemia, unspecified: Secondary | ICD-10-CM | POA: Insufficient documentation

## 2024-05-28 DIAGNOSIS — D472 Monoclonal gammopathy: Secondary | ICD-10-CM | POA: Diagnosis not present

## 2024-05-28 NOTE — Assessment & Plan Note (Addendum)
#    [facility]- IgG-kappa. 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   # No clinical concern for progression to multiple myeloma at this time-see discussion regarding iron deficiency anemia.   #Chronic Mild Iron deficiency anemia/CKD stage III  [primary biliary cirrhosis] however- worsening anemia- Recommend gentle iron [iron biglycinate; 28 mg ] 1 pill a day.  If not improved recommend iron infusions. Ordered- not scheduled.   # Rheumatoid Arthritis/OA- prednisone 2.5 mg/actemra [Dr.Doss; [facility]]- on TNF blockers- stable.   # DISPOSITION:  # follow up 12 months MD- 2 weeks- PRIOR- labs-cbc/cmp; iron studies; ferritin; M protein/Kappa- lamda- light chains-  Dr.B

## 2024-05-28 NOTE — Patient Instructions (Signed)
#  Recommend gentle iron  [iron  biglycinate; 28 mg ] 1 pill a day.  This pill is unlikely to cause stomach upset or cause constipation.  Available Over the counter or talk to pharmacist.

## 2024-05-28 NOTE — Progress Notes (Signed)
 [name] CONSULT NOTE  Patient Care Team: Zachary Idelia LABOR, MD as PCP - General (Family Medicine) Rennie Sandra SAUNDERS, MD as Consulting Physician (Oncology)  CHIEF COMPLAINTS/PURPOSE OF CONSULTATION: Monoclonal gammopathy  HEMATOLOGY HISTORY  . Monoclonal gammopathy of uncertain significance (MGUS), IgG-kappa a. Feb 2015: Work up for anemia (Hgb 9.9); SPEP with no M-spike; IgG kappa detected on serum IFE only; normal creatinine and calcium, albumin 3.1 b. March 2015: IgG kappa detected on IFE only, no M-spike, kappa 6.05 mg/dL, lambda 8.40 mg/dL, ratio 6.18; Hgb 10.8, negative skeletal survey  2. Iron-deficiency anemia 3. Hyperlipidemia 4. HTN 5. Vitamin D deficiency   # CKD stage- [Dr.]  HISTORY OF PRESENTING ILLNESS: Patient ambulating with rolling walker. Accompanied by daughter  Sandra [name] 83 y.o.  female with a history of MGUS-CKD stage III is here for follow-up for her labs.  Patient denies any blood in stools or black-colored stools.  However stopped taking iron pills few months ago because of constipation.  Patient has chronic rheumatoid arthritis/chronic bone pain.  She continues to be on infusions per rheumatology.  Chronic mild fatigue.  Denies any tingling or numbness in extremities.  Review of Systems  Constitutional:  Positive for malaise/fatigue. Negative for chills, diaphoresis, fever and weight loss.  HENT:  Negative for nosebleeds and sore throat.   Eyes:  Negative for double vision.  Respiratory:  Negative for cough, hemoptysis, sputum production, shortness of breath and wheezing.   Cardiovascular:  Positive for leg swelling. Negative for chest pain, palpitations and orthopnea.  Gastrointestinal:  Negative for abdominal pain, blood in stool, constipation, diarrhea, heartburn, melena, nausea and vomiting.  Genitourinary:  Negative for dysuria, frequency and urgency.  Musculoskeletal:  Positive for back pain and joint pain.  Skin:  Negative.  Negative for itching and rash.  Neurological:  Negative for dizziness, tingling, focal weakness, weakness and headaches.  Endo/Heme/Allergies:  Does not bruise/bleed easily.  Psychiatric/Behavioral:  Negative for depression. The patient is not nervous/anxious and does not have insomnia.     MEDICAL HISTORY:  Past Medical History:  Diagnosis Date   Anemia    Hypercholesterolemia    Hypertension    Primary biliary cirrhosis (HCC)    Rheumatoid arthritis (HCC)    Vitamin D deficiency     SURGICAL HISTORY: Past Surgical History:  Procedure Laterality Date   CATARACT EXTRACTION W/PHACO Left 07/20/2019   Procedure: CATARACT EXTRACTION PHACO AND INTRAOCULAR LENS PLACEMENT (IOC) LEFT 01:15.7        13.3%       10.36;  Surgeon: Myrna Adine Anes, MD;  Location: [facility];  Service: Ophthalmology;  Laterality: Left;   CATARACT EXTRACTION W/PHACO Right 08/10/2019   Procedure: CATARACT EXTRACTION PHACO AND INTRAOCULAR LENS PLACEMENT (IOC) RIGHT 7.42  00:44.3;  Surgeon: Myrna Adine Anes, MD;  Location: [facility];  Service: Ophthalmology;  Laterality: Right;   CHOLECYSTECTOMY  2018   COLONOSCOPY WITH PROPOFOL  N/A 09/25/2019   Procedure: COLONOSCOPY WITH PROPOFOL ;  Surgeon: Dessa Reyes ORN, MD;  Location: [facility];  Service: Endoscopy;  Laterality: N/A;   ESOPHAGOGASTRODUODENOSCOPY (EGD) WITH PROPOFOL  N/A 09/25/2019   Procedure: ESOPHAGOGASTRODUODENOSCOPY (EGD) WITH PROPOFOL ;  Surgeon: Dessa Reyes ORN, MD;  Location: [facility];  Service: Endoscopy;  Laterality: N/A;   ESOPHAGOGASTRODUODENOSCOPY (EGD) WITH PROPOFOL  N/A 12/23/2019   Procedure: ESOPHAGOGASTRODUODENOSCOPY (EGD) WITH PROPOFOL ;  Surgeon: Dessa Reyes ORN, MD;  Location: [facility];  Service: Endoscopy;  Laterality: N/A;   LIVER BIOPSY     NASAL POLYP EXCISION  2013   TONSILLECTOMY  1962    SOCIAL HISTORY: Social History   Socioeconomic History   Marital status: Married     Spouse name: Not on file   Number of children: Not on file   Years of education: Not on file   Highest education level: Not on file  Occupational History   Not on file  Tobacco Use   Smoking status: Former    Current packs/day: 0.00    Types: Cigarettes    Quit date: 2013    Years since quitting: 12.7   Smokeless tobacco: Never  Vaping Use   Vaping status: Never Used  Substance and Sexual Activity   Alcohol use: Not Currently   Drug use: Never   Sexual activity: Not on file  Other Topics Concern   Not on file  Social History Narrative   Not on file   Social Drivers of Health   Financial Resource Strain: Low Risk  (08/06/2023)   Received from [facility]   Overall Financial Resource Strain (CARDIA)    Difficulty of Paying Living Expenses: Not hard at all  Food Insecurity: No Food Insecurity (08/06/2023)   Received from [facility]   Hunger Vital Sign    Within the past 12 months, you worried that your food would run out before you got the money to buy more.: Never true    Within the past 12 months, the food you bought just didn't last and you didn't have money to get more.: Never true  Transportation Needs: Unknown (08/06/2023)   Received from [facility] - Transportation    In the past 12 months, has lack of transportation kept you from medical appointments or from getting medications?: No    Lack of Transportation (Non-Medical): Not on file  Physical Activity: Not on file  Stress: Not on file  Social Connections: Not on file  Intimate Partner Violence: Not At Risk (05/28/2023)   Humiliation, Afraid, Rape, and Kick questionnaire    Fear of Current or Ex-Partner: No    Emotionally Abused: No    Physically Abused: No    Sexually Abused: No    FAMILY HISTORY: Family History  Problem Relation Age of Onset   Rheum arthritis Sister    Throat cancer Brother        Monoclonal gammopathy    ALLERGIES:  is  allergic to simvastatin.  MEDICATIONS:  Current Outpatient Medications  Medication Sig Dispense Refill   amLODipine (NORVASC) 5 MG tablet Take 5 mg by mouth daily.     busPIRone (BUSPAR) 7.5 MG tablet Take 7.5 mg by mouth 2 (two) times daily.     Calcium Carbonate-Vitamin D (CALTRATE 600+D PO) Take by mouth.     CHOLECALCIFEROL PO Take 1,000 Units by mouth daily.     escitalopram (LEXAPRO) 10 MG tablet Take 10 mg by mouth daily.     furosemide (LASIX) 20 MG tablet Take 20 mg by mouth 2 (two) times daily as needed for fluid or edema.     metoprolol tartrate (LOPRESSOR) 25 MG tablet Take 25 mg by mouth 2 (two) times daily.     omeprazole (PRILOSEC) 20 MG capsule Take 20 mg by mouth daily.     pravastatin (PRAVACHOL) 20 MG tablet Take 20 mg by mouth daily.     predniSONE (DELTASONE) 2.5 MG tablet Take 2.5 mg by mouth daily with breakfast.     risedronate (ACTONEL) 150 MG tablet Take 150 mg  by mouth every 30 (thirty) days. with water on empty stomach, nothing by mouth or lie down for next 30 minutes.     ursodiol (ACTIGALL) 250 MG tablet Take 500 mg by mouth 2 (two) times daily.     aspirin EC 81 MG tablet Take 81 mg by mouth daily. (Patient not taking: Reported on 05/28/2024)     ibandronate (BONIVA) 150 MG tablet Take 150 mg by mouth every 30 (thirty) days. Take in the morning with a full glass of water, on an empty stomach, and do not take anything else by mouth or lie down for the next 30 min. (Patient not taking: Reported on 05/28/2024)     No current facility-administered medications for this visit.      PHYSICAL EXAMINATION:   Vitals:   05/28/24 1055 05/28/24 1100  BP: (!) 159/72 (!) 160/70  Pulse: 72 70  Resp:    Temp:     Filed Weights   05/28/24 1050  Weight: 153 lb (69.4 kg)    Physical Exam Vitals and nursing note reviewed.  HENT:     Head: Normocephalic and atraumatic.     Mouth/Throat:     Pharynx: Oropharynx is clear.  Eyes:     Extraocular Movements:  Extraocular movements intact.     Pupils: Pupils are equal, round, and reactive to light.  Cardiovascular:     Rate and Rhythm: Normal rate and regular rhythm.  Pulmonary:     Comments: Decreased breath sounds bilaterally.  Abdominal:     Palpations: Abdomen is soft.  Musculoskeletal:        General: Normal range of motion.     Cervical back: Normal range of motion.  Skin:    General: Skin is warm.  Neurological:     General: No focal deficit present.     Mental Status: She is alert and oriented to person, place, and time.  Psychiatric:        Behavior: Behavior normal.        Judgment: Judgment normal.     LABORATORY DATA:  I have reviewed the data as listed Lab Results  Component Value Date   WBC 9.4 05/14/2024   HGB 9.9 (L) 05/14/2024   HCT 32.7 (L) 05/14/2024   MCV 86.7 05/14/2024   PLT 232 05/14/2024   Recent Labs    05/14/24 0952  NA 138  K 3.9  CL 105  CO2 24  GLUCOSE 101*  BUN 26*  CREATININE 1.09*  CALCIUM 9.4  GFRNONAA 51*  PROT 6.6  ALBUMIN 3.8  AST 18  ALT 14  ALKPHOS 54  BILITOT 0.6     No results found.  Lab Results  Component Value Date   KPAFRELGTCHN 166.8 (H) 05/14/2024   KPAFRELGTCHN 154.7 (H) 05/28/2023   LAMBDASER 12.3 05/14/2024   LAMBDASER 13.0 05/28/2023   KAPLAMBRATIO 13.56 (H) 05/14/2024   KAPLAMBRATIO 11.90 (H) 05/28/2023     MGUS (monoclonal gammopathy of unknown significance) #  [facility]- IgG-kappa. 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   # No clinical concern for progression to multiple myeloma at this time-see discussion regarding iron deficiency anemia.   #Chronic Mild Iron deficiency anemia/CKD stage III  [primary biliary cirrhosis] however- worsening anemia- Recommend gentle iron [iron biglycinate; 28 mg ] 1 pill a day.  If not  improved recommend  iron infusions. Ordered- not scheduled.   # Rheumatoid Arthritis/OA- prednisone 2.5 mg/actemra [Dr.Doss; [facility]]- on TNF blockers- stable.   # DISPOSITION:  # follow up 12 months MD- 2 weeks- PRIOR- labs-cbc/cmp; iron studies; ferritin; M protein/Kappa- lamda- light chains-  Dr.B  All questions were answered. The patient knows to call the clinic with any problems, questions or concerns.    Sandra JONELLE Joe, MD 05/28/2024 12:51 PM

## 2024-07-01 NOTE — Progress Notes (Signed)
 [facility] Return Patient Visit  History Sandra [name] is a 83 y.o. 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   Prior History: [name] has had abnormal alkaline phosphatase dating back to 2013. Ranged 150-250.  AMA was negative.  MRCP was normal.  Alk phos isoenzymes showed mild elevation in bone fraction but mainly in liver fraction.  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  AP 200 at time started urso .  150 by Jan 2017. 130 by 08/2016.  Fluctuated some 2017-2020 then normal 09/2019.   Uncomplicated laparoscopic cholecystectomy September 2016 due to progression in size of a polyp detected on ultrasound.  She is well recovered from surgery at this time.  No polyp was seen in the pathology specimen.  Interval history Last seen one year ago, 05/2023.  Still on urso .  LFTS normal recently.  No issues at pharmacy. No interruption in therapy.  No major health issues in past year.  Continue to follow in Rheumatology.  Dr. Valera gets LFTS regularly.  No itching.   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) 08/05/2023   Long-term use of immunosuppressant medication 07/10/2022   Anxiety 09/13/2021   Rheumatoid arthritis of multiple sites (CMS-HCC) 03/19/2019   Anemia of chronic disease 09/30/2018   Hand deformity, unspecified laterality 09/25/2017   MGUS (monoclonal gammopathy of unknown  significance) 05/14/2017   History of iron deficiency anemia 05/14/2017   Primary biliary cholangitis (CMS/HHS-HCC) 02/07/2016   Primary osteoarthritis of both knees 12/22/2015   Renal cyst, right 12/30/2014   Hepatic cyst 12/30/2014   Essential hypertension 12/30/2014   Osteopenia    Vitamin D deficiency 11/03/2013   HLD (hyperlipidemia) 10/26/2013   Hyperlipidemia 08/21/2012   HTN (hypertension)    Nasal bleeding      Past Medical History:  Diagnosis Date   Colon polyp    colonoscopy 09/25/19 and repeat in 5 years   Degenerative joint disease involving multiple joints    Elevated alkaline phosphatase level 11/03/2013   Elevated serum GGT level 11/03/2013   Esophagitis determined by endoscopy    Gallbladder polyp 11/24/2013   Hand deformity, unspecified laterality 09/25/2017   HTN (hypertension)    Hyperlipidemia 08/21/2012   Nasal bleeding    followed by [facility]    Osteopenia 2015   Primary biliary cholangitis (CMS/HHS-HCC) 02/07/2016   Vitamin D deficiency 11/03/2013      Past Surgical History:  Procedure Laterality Date   TONSILLECTOMY  1962   nasal polyp removal  2013   CHOLECYSTECTOMY  05/16/2017   8.5 mm gallbladder polyp on ultrasound, pathology benign   CATARACT EXTRACTION  08/10/2019   Left on 07/20/2019 and right on 08/10/2019   COLONOSCOPY  09/25/2019   Tubular adenoma/Hyperplastic colon polyp/Repeat 76yrs/J[facility]   EGD  09/25/2019   Esophagitis/Atypia/Repeat 2-3 months/[facility]   EGD  12/23/2019   GERD/Intestinal metaplasia/No Repeat/[facility]   ORAL SURGERY OPERATIVE NOTE     removal of teeth      Medications  Current Outpatient Medications:  amLODIPine (NORVASC) 5 MG tablet, Take 1 tablet (5 mg total) by mouth once daily for 265 days, Disp: 90 tablet, Rfl: 2   busPIRone  (BUSPAR ) 7.5 MG tablet, Take 1 tablet (7.5 mg total) by mouth 2 (two) times daily, Disp: 180 tablet, Rfl: 3   calcium carbonate/vitamin D3 (CALTRATE 600 + D  ORAL), Take 1 tablet by mouth once daily, Disp: , Rfl:    cholecalciferol (VITAMIN D3) 1,000 unit tablet, Take 1,000 Units by mouth once daily Reported on 01/04/2016, Disp: , Rfl:    escitalopram  oxalate (LEXAPRO ) 10 MG tablet, TAKE 1 TABLET ONE TIME DAILY, Disp: 100 tablet, Rfl: 3   FUROsemide (LASIX) 20 MG tablet, TAKE 1 TABLET EVERY DAY AS NEEDED FOR EDEMA, Disp: 30 tablet, Rfl: 11   hydrocortisone  (PROCTOCORT ) 1 % rectal cream, Apply topically 2 (two) times daily In am and pm, Disp: 28 g, Rfl: 0   metoprolol  TARTrate (LOPRESSOR ) 50 MG tablet, Take 0.5 tablets (25 mg total) by mouth 2 (two) times daily, Disp: , Rfl:    omeprazole (PRILOSEC) 20 MG DR capsule, TAKE 1 CAPSULE ONE TIME DAILY, Disp: 90 capsule, Rfl: 0   pravastatin  (PRAVACHOL ) 20 MG tablet, Take 1 tablet (20 mg total) by mouth at bedtime, Disp: 90 tablet, Rfl: 3   predniSONE  (DELTASONE ) 2.5 MG tablet, Take 2 tablets (5 mg total) by mouth once daily for 180 days, Disp: 180 tablet, Rfl: 1   psyllium husk (METAMUCIL ORAL), Take by mouth, Disp: , Rfl:    ursodioL  (URSO -250) 250 mg tablet, TAKE 2 TABLETS TWICE DAILY, Disp: 360 tablet, Rfl: 3   predniSONE  (DELTASONE ) 5 MG tablet, Take 3 tablets (15 mg total) by mouth once daily for 5 days, THEN 2 tablets (10 mg total) once daily for 5 days, THEN 1 tablet (5 mg total) once daily for 5 days., Disp: 30 tablet, Rfl: 0  Allergies Allergies  Allergen Reactions   Simvastatin Other (See Comments)    Joint aches      Social History   Socioeconomic History   Marital status: Married   Number of children: 4  Tobacco Use   Smoking status: Former    Current packs/day: 0.00    Average packs/day: 1 pack/day for 40.0 years (40.0 ttl pk-yrs)    Types: Cigarettes    Start date: 05/24/1972    Quit date: 05/24/2012    Years since quitting: 12.1   Smokeless tobacco: Never  Vaping Use   Vaping status: Never Used  Substance and Sexual Activity   Alcohol use: No   Drug use: No    Sexual activity: Never  Social History Narrative   Retired.  Married.   Spouse died 07-11-2023     08-18-23   Likes/Enjoys/What fills your day?:    Military Service: None   Driving Status: Reports does not drive   Home: Two Story   Your Bedrooms is on: First Level   Fewest Steps to enter the home: with handrails   Other persons in the home: no one   Pets: none   Medical equipment you use daily: Cane, Small-based [employer] and [employer] available in the home: 2 grab bars, wheelchair, shower chair   Audiology/Hearing: Denies any issues in Hearing .Reported as not needed,    Dermatology: Denies areas of concern.  Screening is: Reported as not needed,    Dental: Dentures, Complete.    Vision: Wears prescription glasses. Last screening Date: December 2023. Screening is: Up to date, Provider  Name: [facility]       Social Drivers of Health   Financial Resource Strain: Low Risk  (08/06/2023)   Overall Financial Resource Strain (CARDIA)    Difficulty of Paying Living Expenses: Not hard at all  Food Insecurity: No Food Insecurity (08/06/2023)   Hunger Vital Sign    Worried About Running Out of Food in the Last Year: Never true    Ran Out of Food in the Last Year: Never true  Transportation Needs: Unknown (08/06/2023)   PRAPARE - Administrator, Civil Service (Medical): No    Family History  Problem Relation Name Age of Onset   Thyroid disease Mother     Dementia Mother     No Known Problems Father     Diabetes type II Sister Candis    Thyroid disease Sister Candis    Rheum arthritis Sister Candis    Liver disease Neg Hx     Liver cancer Neg Hx     Cirrhosis Neg Hx     Anesthesia problems Neg Hx     Malignant hypertension Neg Hx     Malignant hyperthermia Neg Hx     Pseudochol deficiency Neg Hx      Review of Systems Constitutional: + fatigue Eyes: no visual changes Ears, nose, throat: no sore throat, no hearing  changes Respiratory: no SOB CV: no CP, syncope GI: see HPI GU: no urinary difficulties Integumentary: no rashes Hematologic: no bruising M/S: + arthritis Neuro: no headaches, weakness, paresthesias Behavior/psych: no depression or anxiety   Physical examination There were no vitals filed for this visit.   Ht:167.6 cm (5' 6) Wt:70.8 kg (156 lb) ADJ:Anib surface area is 1.82 meters squared. Body mass index is 25.18 kg/m. General appearance: alert and cooperative   Review of records 1. Labs  Lab Results  Component Value Date   ALT 16 06/03/2024   AST 22 06/03/2024   GGT 116 (H) 11/02/2013   ALKPHOS 58 06/03/2024       Assessment/Plan:   1. Primary biliary cholangitis Alkaline phosphatase remains normal.  She will continue on the ursodiol  indefinitely at this point.  She has had a very nice response.  We talked about getting liver tests during some of these visits with other doctors.  We will plan an annual visit unless there is an urgent issue.  We recalculated her ursodiol  dose to confirm she is appropriate.  She is currently taking at 1000 mg/day.  At a weight of 70 kg, her daily weight based dose of 13 to 15 mg/kg/day should be 910 to 1050 mg, so good there.   The risks and benefits of my recommendations, as well as other treatment options were discussed with the patient and daughter today. Questions were answered.   Follow up: 12 months and as needed.   Attestation Statement:   I personally performed the service, non-incident to. (WP)   ELIZABETH MARLA AMEL, PA   This video encounter was conducted with the patient's (or proxy's) verbal consent via secure, interactive audio and video telecommunications while away from clinic/office/hospital.  The patient (or proxy) was instructed to have this encounter in a suitably private space and to only have persons present to whom they give permission to participate. In addition, patient identity was confirmed by use of  name plus two identifiers.  10-17-2019 E&M) This visit was coded based on time. I spent a total of 25 minutes in both face-to-face and non-face-to-face activities for this visit on  the date of this encounter.

## 2024-07-02 NOTE — Progress Notes (Signed)
 I was immediately available to provide assistance and direction throughout the infusion procedure and treatment. TYWANA DONALDSON, NP 07/01/2024

## 2024-07-29 NOTE — Progress Notes (Signed)
 TREATMENT DOCUMENTATION  Treatment: actemra Induction dose:  No  Total Fluids Given: 100 mL Duration of treatment: 1 hour    Care Summary:  Patient is A&Ox3, Any c/o discomfort at this time and VS are WNL for patient.  No adverse reactions were noted during or after infusion.   Patient declined AVS, utilizes my chart, and has no questions at this time.   Patient educated on medication side effects yes  Patient knowledgeable regarding all aspects of self care and disease management.              COMPLICATIONS: no

## 2024-07-31 NOTE — Progress Notes (Signed)
 Medications  As of 07/31/24 1014    tocilizumab (ACTEMRA) 480 mg in sodium chloride  0.9% 100 mL IVPB (mL/hr) Total volume:  Not documented* Dosing weight:  77.9  *Total volume has not been documented. View each administration to see the amount administered.   Date/Time Rate/Dose/Volume Action Route Admin User     07/29/24  1406 480 mg - 100 mL/hr (over 60 min) New Bag Intravenous Onesimo Castilla, RN     [phone]  (over 60 min) Stopped Intravenous Farnell, Holly, RN                 I was immediately available to provide assistance and direction throughout the infusion procedure and treatment.     Dedra Baker Tsiopanas, NP

## 2024-08-26 NOTE — Progress Notes (Signed)
 Medications  As of 08/26/24 1559    tocilizumab (ACTEMRA) 480 mg in sodium chloride  0.9% 100 mL IVPB (mL/hr) Total volume:  Not documented* Dosing weight:  77  *Total volume has not been documented. View each administration to see the amount administered.   Date/Time Rate/Dose/Volume Action Route Admin User     08/26/24  1400 480 mg - 100 mL/hr (over 60 min) New Bag Intravenous Faustino Moats, RN     [phone]  (over 60 min) Stopped Intravenous Faustino Moats, RN                 I was immediately available to provide assistance and direction throughout the infusion procedure and treatment.     Dedra Baker Tsiopanas, NP

## 2024-09-19 ENCOUNTER — Emergency Department

## 2024-09-19 ENCOUNTER — Encounter: Payer: Self-pay | Admitting: Internal Medicine

## 2024-09-19 ENCOUNTER — Inpatient Hospital Stay
Admission: EM | Admit: 2024-09-19 | Discharge: 2024-09-25 | DRG: 372 | Disposition: A | Discharge status: Skilled Nursing Facility | Attending: Internal Medicine | Admitting: Internal Medicine

## 2024-09-19 ENCOUNTER — Other Ambulatory Visit: Payer: Self-pay

## 2024-09-19 DIAGNOSIS — I10 Essential (primary) hypertension: Secondary | ICD-10-CM | POA: Diagnosis present

## 2024-09-19 DIAGNOSIS — A040 Enteropathogenic Escherichia coli infection: Secondary | ICD-10-CM | POA: Diagnosis present

## 2024-09-19 DIAGNOSIS — E860 Dehydration: Secondary | ICD-10-CM | POA: Diagnosis present

## 2024-09-19 DIAGNOSIS — N179 Acute kidney failure, unspecified: Secondary | ICD-10-CM | POA: Diagnosis present

## 2024-09-19 DIAGNOSIS — K838 Other specified diseases of biliary tract: Secondary | ICD-10-CM | POA: Diagnosis present

## 2024-09-19 DIAGNOSIS — K5792 Diverticulitis of intestine, part unspecified, without perforation or abscess without bleeding: Secondary | ICD-10-CM | POA: Diagnosis present

## 2024-09-19 DIAGNOSIS — K529 Noninfective gastroenteritis and colitis, unspecified: Secondary | ICD-10-CM | POA: Diagnosis present

## 2024-09-19 DIAGNOSIS — E876 Hypokalemia: Secondary | ICD-10-CM | POA: Diagnosis present

## 2024-09-19 DIAGNOSIS — R1032 Left lower quadrant pain: Secondary | ICD-10-CM

## 2024-09-19 DIAGNOSIS — Z87891 Personal history of nicotine dependence: Secondary | ICD-10-CM | POA: Diagnosis not present

## 2024-09-19 DIAGNOSIS — Z7952 Long term (current) use of systemic steroids: Secondary | ICD-10-CM | POA: Diagnosis not present

## 2024-09-19 DIAGNOSIS — K743 Primary biliary cirrhosis: Secondary | ICD-10-CM | POA: Diagnosis present

## 2024-09-19 DIAGNOSIS — K5732 Diverticulitis of large intestine without perforation or abscess without bleeding: Secondary | ICD-10-CM | POA: Diagnosis present

## 2024-09-19 DIAGNOSIS — F32A Depression, unspecified: Secondary | ICD-10-CM | POA: Diagnosis present

## 2024-09-19 DIAGNOSIS — Z1152 Encounter for screening for COVID-19: Secondary | ICD-10-CM

## 2024-09-19 DIAGNOSIS — Z808 Family history of malignant neoplasm of other organs or systems: Secondary | ICD-10-CM | POA: Diagnosis not present

## 2024-09-19 DIAGNOSIS — Z79899 Other long term (current) drug therapy: Secondary | ICD-10-CM | POA: Diagnosis not present

## 2024-09-19 DIAGNOSIS — M069 Rheumatoid arthritis, unspecified: Secondary | ICD-10-CM | POA: Diagnosis present

## 2024-09-19 DIAGNOSIS — D849 Immunodeficiency, unspecified: Secondary | ICD-10-CM | POA: Diagnosis present

## 2024-09-19 DIAGNOSIS — D472 Monoclonal gammopathy: Secondary | ICD-10-CM | POA: Diagnosis present

## 2024-09-19 DIAGNOSIS — R5383 Other fatigue: Secondary | ICD-10-CM

## 2024-09-19 DIAGNOSIS — Z888 Allergy status to other drugs, medicaments and biological substances status: Secondary | ICD-10-CM

## 2024-09-19 DIAGNOSIS — Z7982 Long term (current) use of aspirin: Secondary | ICD-10-CM | POA: Diagnosis not present

## 2024-09-19 DIAGNOSIS — Z8261 Family history of arthritis: Secondary | ICD-10-CM

## 2024-09-19 DIAGNOSIS — E7800 Pure hypercholesterolemia, unspecified: Secondary | ICD-10-CM | POA: Diagnosis present

## 2024-09-19 DIAGNOSIS — R197 Diarrhea, unspecified: Principal | ICD-10-CM

## 2024-09-19 LAB — CBC WITH DIFFERENTIAL/PLATELET
Abs Immature Granulocytes: 0.15 K/uL — ABNORMAL HIGH (ref 0.00–0.07)
Basophils Absolute: 0.1 K/uL (ref 0.0–0.1)
Basophils Relative: 0 %
Eosinophils Absolute: 0 K/uL (ref 0.0–0.5)
Eosinophils Relative: 0 %
HCT: 35.1 % — ABNORMAL LOW (ref 36.0–46.0)
Hemoglobin: 11.3 g/dL — ABNORMAL LOW (ref 12.0–15.0)
Immature Granulocytes: 1 %
Lymphocytes Relative: 4 %
Lymphs Abs: 0.7 K/uL (ref 0.7–4.0)
MCH: 29.9 pg (ref 26.0–34.0)
MCHC: 32.2 g/dL (ref 30.0–36.0)
MCV: 92.9 fL (ref 80.0–100.0)
Monocytes Absolute: 0.6 K/uL (ref 0.1–1.0)
Monocytes Relative: 4 %
Neutro Abs: 14.8 K/uL — ABNORMAL HIGH (ref 1.7–7.7)
Neutrophils Relative %: 91 %
Platelets: 134 K/uL — ABNORMAL LOW (ref 150–400)
RBC: 3.78 MIL/uL — ABNORMAL LOW (ref 3.87–5.11)
RDW: 15.9 % — ABNORMAL HIGH (ref 11.5–15.5)
Smear Review: NORMAL
WBC: 16.4 K/uL — ABNORMAL HIGH (ref 4.0–10.5)
nRBC: 0 % (ref 0.0–0.2)

## 2024-09-19 LAB — URINALYSIS, W/ REFLEX TO CULTURE (INFECTION SUSPECTED)
Bacteria, UA: NONE SEEN
Bilirubin Urine: NEGATIVE
Glucose, UA: NEGATIVE mg/dL
Ketones, ur: NEGATIVE mg/dL
Leukocytes,Ua: NEGATIVE
Nitrite: NEGATIVE
Protein, ur: NEGATIVE mg/dL
RBC / HPF: 0 RBC/hpf (ref 0–5)
Specific Gravity, Urine: 1.031 — ABNORMAL HIGH (ref 1.005–1.030)
pH: 5 (ref 5.0–8.0)

## 2024-09-19 LAB — COMPREHENSIVE METABOLIC PANEL WITH GFR
ALT: 79 U/L — ABNORMAL HIGH (ref 0–44)
AST: 61 U/L — ABNORMAL HIGH (ref 15–41)
Albumin: 2.8 g/dL — ABNORMAL LOW (ref 3.5–5.0)
Alkaline Phosphatase: 259 U/L — ABNORMAL HIGH (ref 38–126)
Anion gap: 14 (ref 5–15)
BUN: 29 mg/dL — ABNORMAL HIGH (ref 8–23)
CO2: 21 mmol/L — ABNORMAL LOW (ref 22–32)
Calcium: 9 mg/dL (ref 8.9–10.3)
Chloride: 98 mmol/L (ref 98–111)
Creatinine, Ser: 1.54 mg/dL — ABNORMAL HIGH (ref 0.44–1.00)
GFR, Estimated: 33 mL/min — ABNORMAL LOW
Glucose, Bld: 103 mg/dL — ABNORMAL HIGH (ref 70–99)
Potassium: 3.3 mmol/L — ABNORMAL LOW (ref 3.5–5.1)
Sodium: 132 mmol/L — ABNORMAL LOW (ref 135–145)
Total Bilirubin: 0.6 mg/dL (ref 0.0–1.2)
Total Protein: 6.1 g/dL — ABNORMAL LOW (ref 6.5–8.1)

## 2024-09-19 LAB — TROPONIN T, HIGH SENSITIVITY
Troponin T High Sensitivity: 44 ng/L — ABNORMAL HIGH (ref 0–19)
Troponin T High Sensitivity: 38 ng/L — ABNORMAL HIGH (ref 0–19)

## 2024-09-19 LAB — RESP PANEL BY RT-PCR (RSV, FLU A&B, COVID)  RVPGX2
Influenza A by PCR: NEGATIVE
Influenza B by PCR: NEGATIVE
Resp Syncytial Virus by PCR: NEGATIVE
SARS Coronavirus 2 by RT PCR: NEGATIVE

## 2024-09-19 LAB — MAGNESIUM: Magnesium: 1.4 mg/dL — ABNORMAL LOW (ref 1.7–2.4)

## 2024-09-19 LAB — PHOSPHORUS: Phosphorus: 2.5 mg/dL (ref 2.5–4.6)

## 2024-09-19 MED ORDER — SENNOSIDES-DOCUSATE SODIUM 8.6-50 MG PO TABS: 1.0000 | ORAL_TABLET | Freq: Every evening | ORAL | Status: DC | PRN

## 2024-09-19 MED ORDER — METRONIDAZOLE 500 MG/100ML IV SOLN
500.0000 mg | Freq: Two times a day (BID) | INTRAVENOUS | Status: DC
  Administered 2024-09-19 – 2024-09-25 (×13): 500 mg via INTRAVENOUS
  Filled 2024-09-19 (×14): qty 100

## 2024-09-19 MED ORDER — SODIUM CHLORIDE 0.9 % IV SOLN: INTRAVENOUS | Status: AC

## 2024-09-19 MED ORDER — HEPARIN SODIUM (PORCINE) 5000 UNIT/ML IJ SOLN
5000.0000 [IU] | Freq: Two times a day (BID) | INTRAMUSCULAR | Status: DC
  Administered 2024-09-19 – 2024-09-25 (×13): 5000 [IU] via SUBCUTANEOUS
  Filled 2024-09-19 (×13): qty 1

## 2024-09-19 MED ORDER — ACETAMINOPHEN 325 MG PO TABS: 650.0000 mg | ORAL_TABLET | Freq: Four times a day (QID) | ORAL | Status: DC | PRN

## 2024-09-19 MED ORDER — IOHEXOL 300 MG/ML  SOLN
80.0000 mL | Freq: Once | INTRAMUSCULAR | Status: AC | PRN
  Administered 2024-09-19: 80 mL via INTRAVENOUS

## 2024-09-19 MED ORDER — SODIUM CHLORIDE 0.9 % IV SOLN
2.0000 g | INTRAVENOUS | Status: DC
  Administered 2024-09-20 – 2024-09-25 (×6): 2 g via INTRAVENOUS
  Filled 2024-09-19 (×6): qty 20

## 2024-09-19 MED ORDER — TRAZODONE HCL 50 MG PO TABS: 25.0000 mg | ORAL_TABLET | Freq: Every evening | ORAL | Status: DC | PRN

## 2024-09-19 MED ORDER — ONDANSETRON HCL 4 MG/2ML IJ SOLN: 4.0000 mg | Freq: Four times a day (QID) | INTRAMUSCULAR | Status: DC | PRN

## 2024-09-19 MED ORDER — ACETAMINOPHEN 650 MG RE SUPP: 650.0000 mg | Freq: Four times a day (QID) | RECTAL | Status: DC | PRN

## 2024-09-19 MED ORDER — URSODIOL 250 MG PO TABS: 500.0000 mg | ORAL_TABLET | Freq: Two times a day (BID) | ORAL | Status: DC

## 2024-09-19 MED ORDER — BUSPIRONE HCL 15 MG PO TABS
7.5000 mg | ORAL_TABLET | Freq: Two times a day (BID) | ORAL | Status: DC
  Administered 2024-09-19 – 2024-09-25 (×13): 7.5 mg via ORAL
  Filled 2024-09-19: qty 1
  Filled 2024-09-19: qty 2
  Filled 2024-09-19 (×12): qty 1

## 2024-09-19 MED ORDER — SODIUM CHLORIDE 0.9 % IV BOLUS
1000.0000 mL | Freq: Once | INTRAVENOUS | Status: AC
  Administered 2024-09-19: 1000 mL via INTRAVENOUS

## 2024-09-19 MED ORDER — ONDANSETRON HCL 4 MG PO TABS: 4.0000 mg | ORAL_TABLET | Freq: Four times a day (QID) | ORAL | Status: DC | PRN

## 2024-09-19 MED ORDER — URSODIOL 300 MG PO CAPS
300.0000 mg | ORAL_CAPSULE | Freq: Three times a day (TID) | ORAL | Status: DC
  Administered 2024-09-19 – 2024-09-25 (×18): 300 mg via ORAL
  Filled 2024-09-19 (×22): qty 1

## 2024-09-19 MED ORDER — POTASSIUM CHLORIDE CRYS ER 20 MEQ PO TBCR
40.0000 meq | EXTENDED_RELEASE_TABLET | Freq: Once | ORAL | Status: AC
  Administered 2024-09-19: 40 meq via ORAL
  Filled 2024-09-19: qty 2

## 2024-09-19 MED ORDER — ESCITALOPRAM OXALATE 10 MG PO TABS
10.0000 mg | ORAL_TABLET | Freq: Every day | ORAL | Status: DC
  Administered 2024-09-19 – 2024-09-25 (×7): 10 mg via ORAL
  Filled 2024-09-19 (×8): qty 1

## 2024-09-19 MED ORDER — PRAVASTATIN SODIUM 20 MG PO TABS
20.0000 mg | ORAL_TABLET | Freq: Every day | ORAL | Status: DC
  Administered 2024-09-19 – 2024-09-24 (×6): 20 mg via ORAL
  Filled 2024-09-19 (×6): qty 1

## 2024-09-19 MED ORDER — PANTOPRAZOLE SODIUM 40 MG PO TBEC
40.0000 mg | DELAYED_RELEASE_TABLET | Freq: Every day | ORAL | Status: DC
  Administered 2024-09-19 – 2024-09-25 (×7): 40 mg via ORAL
  Filled 2024-09-19 (×7): qty 1

## 2024-09-19 MED ORDER — RISAQUAD PO CAPS
1.0000 | ORAL_CAPSULE | Freq: Three times a day (TID) | ORAL | Status: DC
  Administered 2024-09-19 – 2024-09-25 (×18): 1 via ORAL
  Filled 2024-09-19 (×19): qty 1

## 2024-09-19 MED ORDER — HYDROMORPHONE HCL 1 MG/ML IJ SOLN: 0.5000 mg | INTRAMUSCULAR | Status: DC | PRN

## 2024-09-19 MED ORDER — SODIUM CHLORIDE 0.9 % IV SOLN
1.0000 g | Freq: Once | INTRAVENOUS | Status: AC
  Administered 2024-09-19: 1 g via INTRAVENOUS
  Filled 2024-09-19: qty 10

## 2024-09-19 MED ORDER — PREDNISONE 10 MG PO TABS: 5.0000 mg | ORAL_TABLET | Freq: Every day | ORAL | Status: DC

## 2024-09-19 MED ORDER — GERHARDT'S BUTT CREAM
TOPICAL_CREAM | Freq: Four times a day (QID) | CUTANEOUS | Status: DC | PRN
  Filled 2024-09-19: qty 60

## 2024-09-19 MED ORDER — METOPROLOL TARTRATE 25 MG PO TABS
25.0000 mg | ORAL_TABLET | Freq: Two times a day (BID) | ORAL | Status: DC
  Administered 2024-09-19 – 2024-09-25 (×13): 25 mg via ORAL
  Filled 2024-09-19 (×13): qty 1

## 2024-09-19 NOTE — ED Provider Notes (Signed)
 "  [facility] Provider Note    Event Date/Time   First MD Initiated Contact with Patient 09/19/24 0600     (approximate)   History   Diarrhea   HPI  Sandra [name] is a 84 y.o. female   Past medical history of rheumatoid arthritis on daily prednisone , hypertension hyperlipidemia, primary biliary cirrhosis, here with watery diarrhea for 5 days.  Quite profuse multiple episodes per day.  Less and slightly over the last 2 days after taking antidiarrhea medications.  Progressively worsening fatigue feels dehydrated despite taking some p.o. intake.  Denies vomiting.  Mild crampy left lower quadrant pain intermittently.  No GI bleeding.  No urinary symptoms.  Denies fevers or chills.  No respiratory infectious symptoms or chest pain or shortness of breath.  She does feel quite worn out.  Typically able to perform activities of daily living and lives alone and ambulates with walker however just sitting up in bed or walking a few steps makes her extremely tired and she is unable to do so last couple of days.    Independent Historian contributed to assessment above: Family members at bedside corroborate information past medical history as above  External Medical Documents Reviewed: Outpatient notes from yesterday's doctors appointment documenting her diarrheal illness.      Physical Exam   Triage Vital Signs: ED Triage Vitals  Encounter Vitals Group     BP 09/19/24 0601 111/62     Girls Systolic BP Percentile --      Girls Diastolic BP Percentile --      Boys Systolic BP Percentile --      Boys Diastolic BP Percentile --      Pulse Rate 09/19/24 0601 95     Resp 09/19/24 0601 20     Temp 09/19/24 0601 98 F (36.7 C)     Temp Source 09/19/24 0601 Oral     SpO2 09/19/24 0601 94 %     Weight 09/19/24 0602 173 lb (78.5 kg)     Height 09/19/24 0602 5' 6 (1.676 m)     Head Circumference --      Peak Flow --      Pain Score 09/19/24 0601 0     Pain  Loc --      Pain Education --      Exclude from Growth Chart --     Most recent vital signs: Vitals:   09/19/24 0601  BP: 111/62  Pulse: 95  Resp: 20  Temp: 98 F (36.7 C)  SpO2: 94%    General: Awake, no distress.  CV:  Good peripheral perfusion.  Resp:  Normal effort.  Abd:  No distention.  Other:  Dry mucous membranes poor skin turgor looks dehydrated clinically.  Mild tenderness to left lower quadrant without rigidity or guarding.  Vital signs normal   ED Results / Procedures / Treatments   Labs (all labs ordered are listed, but only abnormal results are displayed) Labs Reviewed  COMPREHENSIVE METABOLIC PANEL WITH GFR - Abnormal; Notable for the following components:      Result Value   Sodium 132 (*)    Potassium 3.3 (*)    CO2 21 (*)    Glucose, Bld 103 (*)    BUN 29 (*)    Creatinine, Ser 1.54 (*)    Total Protein 6.1 (*)    Albumin 2.8 (*)    AST 61 (*)    ALT 79 (*)    Alkaline Phosphatase 259 (*)  GFR, Estimated 33 (*)    All other components within normal limits  TROPONIN T, HIGH SENSITIVITY - Abnormal; Notable for the following components:   Troponin T High Sensitivity 44 (*)    All other components within normal limits  RESP PANEL BY RT-PCR (RSV, FLU A&B, COVID)  [id number]  CBC WITH DIFFERENTIAL/PLATELET  URINALYSIS, W/ REFLEX TO CULTURE (INFECTION SUSPECTED)  CBC WITH DIFFERENTIAL/PLATELET     I ordered and reviewed the above labs they are notable for creatinine 1.54 and a GFR of 33, similar to yesterday's testing.  Initial troponin 44.  EKG  ED ECG REPORT I, Ginnie Shams, the attending physician, personally viewed and interpreted this ECG.   Date: 09/19/2024  EKG Time: 0623  Rate: 70  Rhythm: sinus  Axis: nl  Intervals:nl  ST&T Change: no stemi   PROCEDURES:  Critical Care performed: No  Procedures   MEDICATIONS ORDERED IN ED: Medications  sodium chloride  0.9 % bolus 1,000 mL (1,000 mLs Intravenous New Bag/Given 09/19/24 0619)      IMPRESSION / MDM / ASSESSMENT AND PLAN / ED COURSE  I reviewed the triage vital signs and the nursing notes.                                Patient's presentation is most consistent with acute presentation with potential threat to life or bodily function.  Differential diagnosis includes, but is not limited to, diarrheal illness, colitis, enteritis, intra-abdominal infection, diverticulitis, dehydration or electrolyte derangements, viral illness   The patient is on the cardiac monitor to evaluate for evidence of arrhythmia and/or significant heart rate changes.  MDM:    Profuse watery diarrhea and left lower quadrant tenderness in this immunocompromised patient with normal vital signs but appears clinically dehydrated and quite fatigued from her illness.  Will give IV fluid bolus, check electrolytes, check CT scan of the abdomen and pelvis.  Anticipate even if the above workup is unremarkable, would need reassessment and ambulation challenge and unless she is markedly improved and feels safe going home with her profound fatigue/dehydrated state may need hospitalization.  At the time of signout workup as above and reassessment is pending for disposition planning.       FINAL CLINICAL IMPRESSION(S) / ED DIAGNOSES   Final diagnoses:  Diarrhea, unspecified type  Left lower quadrant pain  Other fatigue  Dehydration     Rx / DC Orders   ED Discharge Orders     None        Note:  This document was prepared using Dragon voice recognition software and may include unintentional dictation errors.    Shams Ginnie, MD 09/19/24 [phone]  "

## 2024-09-19 NOTE — ED Provider Notes (Signed)
 84 year old female history of rheumatoid arthritis as well as primary biliary cirrhosis who presents today with concern of poor p.o. intake nausea vomiting and lower abdominal pain.  At the time of signout was awaiting CT imaging of the abdomen pelvis.  CT imaging demonstrates diffuse colitis as well as diverticulitis.  Patient does appear overall well while resting but when attempting to get up becomes very symptomatic, given her lab work at this time as well as her physical exam, will reach out to medicine to discuss admission and further workup.  I am not sure what to make of her transaminitis further this could be secondary to her known PBC, or other pathology.  The CT imaging does read as a dilated CBD without evidence of obstructive pathology but she has relatively minimal right upper quadrant tenderness.  I spoke with Dr. Laurita from medicine team was agreed to assess and evaluate the patient determine course for further medical management at this time.   Sandra Rossie HERO, MD 09/19/24 [phone]

## 2024-09-19 NOTE — ED Triage Notes (Signed)
 BIB EMS for loose stools for five days. No fevers. Having SOB, EMS put on 3 liters. Ox started at 95 RA, desated on RA to 87, 3 liters of oxygen stating at 96. Using walk at baseline has felt increasing weakness.  HX of RA with infusions, next infusion 1/15 BP 150/80, HR 74, temp 98.2

## 2024-09-19 NOTE — H&P (Signed)
 " History and Physical    Sandra [name] FMW:[id number] DOB: 12-26-1940 DOA: 09/19/2024  PCP: Zachary Idelia DELENA, MD (Confirm with patient/family/NH records and if not entered, this has to be entered at [facility] point of entry) Patient coming from: Home  I have personally briefly reviewed patient's old medical records in [facility]  Chief Complaint: Diarrhea  HPI: Sandra [name] is a 84 y.o. female with medical history significant of rheumatoid arthritis on prednisone  and Actemra, primary biliary cholangitis, MGUS, HTN, presented with worsening of diarrhea.  Symptoms started 5 days ago when patient started to pass watery diarrhea 3-4 times a day without any mucus or blood, he has mild discomfort on lower abdomen but denied any abdominal pain, no nauseous vomiting.  After 3 days, his diarrhea appeared to be improving and started to have formed bowel movement however yesterday evening patient started to pass watery diarrhea again 3 times, when daughter decided to bring him to ED.  She denies any fever chills, no nausea vomiting no urinary symptoms. ED Course: Afebrile, nontachycardic blood pressure 153/50.  CT abdomen pelvis showed diffuse colitis/Colitis, blood work showed WBC 16.4 hemoglobin 11.3 BUN 29 creatinine 1.5 compared to baseline 1.0, sodium 132 potassium 3.3 bicarb 21 AST 61 ALT 79.  Patient was given ceftriaxone  and in the ED.  Review of Systems: As per HPI otherwise 14 point review of systems negative.    Past Medical History:  Diagnosis Date   Anemia    Hypercholesterolemia    Hypertension    Primary biliary cirrhosis (HCC)    Rheumatoid arthritis (HCC)    Vitamin D deficiency     Past Surgical History:  Procedure Laterality Date   CATARACT EXTRACTION W/PHACO Left 07/20/2019   Procedure: CATARACT EXTRACTION PHACO AND INTRAOCULAR LENS PLACEMENT (IOC) LEFT 01:15.7        13.3%       10.36;  Surgeon: Myrna Adine Anes, MD;  Location: [facility];  Service:  Ophthalmology;  Laterality: Left;   CATARACT EXTRACTION W/PHACO Right 08/10/2019   Procedure: CATARACT EXTRACTION PHACO AND INTRAOCULAR LENS PLACEMENT (IOC) RIGHT 7.42  00:44.3;  Surgeon: Myrna Adine Anes, MD;  Location: [facility];  Service: Ophthalmology;  Laterality: Right;   CHOLECYSTECTOMY  2018   COLONOSCOPY WITH PROPOFOL  N/A 09/25/2019   Procedure: COLONOSCOPY WITH PROPOFOL ;  Surgeon: Dessa Reyes ORN, MD;  Location: [facility];  Service: Endoscopy;  Laterality: N/A;   ESOPHAGOGASTRODUODENOSCOPY (EGD) WITH PROPOFOL  N/A 09/25/2019   Procedure: ESOPHAGOGASTRODUODENOSCOPY (EGD) WITH PROPOFOL ;  Surgeon: Dessa Reyes ORN, MD;  Location: [facility] END[facility];  Service: Endoscopy;  Laterality: N/A;   ESOPHAGOGASTRODUODENOSCOPY (EGD) WITH PROPOFOL  N/A 12/23/2019   Procedure: ESOPHAGOGASTRODUODENOSCOPY (EGD) WITH PROPOFOL ;  Surgeon: Dessa Reyes ORN, MD;  Location: [facility] END[facility];  Service: Endoscopy;  Laterality: N/A;   LIVER BIOPSY     NASAL POLYP EXCISION  2013   TONSILLECTOMY  1962     reports that she quit smoking about 13 years ago. Her smoking use included cigarettes. She has never used smokeless tobacco. She reports that she does not currently use alcohol. She reports that she does not use drugs.  Allergies[1]  Family History  Problem Relation Age of Onset   Rheum arthritis Sister    Throat cancer Brother        Monoclonal gammopathy     Prior to Admission medications  Medication Sig Start Date End Date Taking? Authorizing Provider  amLODipine (NORVASC) 5 MG tablet Take 5 mg by mouth daily. 02/04/24 10/26/24  [provider]  aspirin EC 81 MG tablet Take 81 mg by mouth daily. Patient not taking: Reported on 05/28/2024    [provider]  busPIRone  (BUSPAR ) 7.5 MG tablet Take 7.5 mg by mouth 2 (two) times daily. 01/02/24 01/01/25  [provider]  Calcium Carbonate-Vitamin D (CALTRATE 600+D PO) Take by mouth.    [provider]   CHOLECALCIFEROL PO Take 1,000 Units by mouth daily.    [provider]  escitalopram  (LEXAPRO ) 10 MG tablet Take 10 mg by mouth daily. 03/23/24   [provider]  furosemide (LASIX) 20 MG tablet Take 20 mg by mouth 2 (two) times daily as needed for fluid or edema. 05/15/24   [provider]  ibandronate (BONIVA) 150 MG tablet Take 150 mg by mouth every 30 (thirty) days. Take in the morning with a full glass of water, on an empty stomach, and do not take anything else by mouth or lie down for the next 30 min. Patient not taking: Reported on 05/28/2024    [provider]  metoprolol  tartrate (LOPRESSOR ) 25 MG tablet Take 25 mg by mouth 2 (two) times daily.    [provider]  omeprazole (PRILOSEC) 20 MG capsule Take 20 mg by mouth daily. 04/09/24   [provider]  pravastatin  (PRAVACHOL ) 20 MG tablet Take 20 mg by mouth daily.    [provider]  predniSONE  (DELTASONE ) 2.5 MG tablet Take 2.5 mg by mouth daily with breakfast.    [provider]  risedronate (ACTONEL) 150 MG tablet Take 150 mg by mouth every 30 (thirty) days. with water on empty stomach, nothing by mouth or lie down for next 30 minutes.    [provider]  ursodiol  (ACTIGALL ) 250 MG tablet Take 500 mg by mouth 2 (two) times daily.    [provider]    Physical Exam: Vitals:   09/19/24 0930 09/19/24 1000 09/19/24 1100 09/19/24 1130  BP: (!) 138/53 (!) 152/56 (!) 148/60 (!) 143/59  Pulse: 69 70    Resp: (!) 22 (!) 26 (!) 29 (!) 21  Temp:      TempSrc:      SpO2: (!) 89% 93%    Weight:      Height:        Constitutional: NAD, calm, comfortable Vitals:   09/19/24 0930 09/19/24 1000 09/19/24 1100 09/19/24 1130  BP: (!) 138/53 (!) 152/56 (!) 148/60 (!) 143/59  Pulse: 69 70    Resp: (!) 22 (!) 26 (!) 29 (!) 21  Temp:      TempSrc:      SpO2: (!) 89% 93%    Weight:      Height:       Eyes: PERRL, lids and conjunctivae normal ENMT:  Mucous membranes are moist. Posterior pharynx clear of any exudate or lesions.Normal dentition.  Neck: normal, supple, no masses, no thyromegaly Respiratory: clear to auscultation bilaterally, no wheezing, no crackles. Normal respiratory effort. No accessory muscle use.  Cardiovascular: Regular rate and rhythm, no murmurs / rubs / gallops. No extremity edema. 2+ pedal pulses. No carotid bruits.  Abdomen: no tenderness, no masses palpated. No hepatosplenomegaly. Bowel sounds positive.  Musculoskeletal: no clubbing / cyanosis. No joint deformity upper and lower extremities. Good ROM, no contractures. Normal muscle tone.  Skin: no rashes, lesions, ulcers. No induration Neurologic: CN 2-12 grossly intact. Sensation intact, DTR normal. Strength 5/5 in all 4.  Psychiatric: Normal judgment and insight. Alert and oriented x 3. Normal mood.  Labs on Admission: I have personally reviewed following labs and imaging studies  CBC: Recent Labs  Lab 09/19/24 0605  WBC 16.4*  NEUTROABS 14.8*  HGB 11.3*  HCT 35.1*  MCV 92.9  PLT 134*   Basic Metabolic Panel: Recent Labs  Lab 09/19/24 0611  NA 132*  K 3.3*  CL 98  CO2 21*  GLUCOSE 103*  BUN 29*  CREATININE 1.54*  CALCIUM 9.0   GFR: Estimated Creatinine Clearance: 29.8 mL/min (A) (by C-G formula based on SCr of 1.54 mg/dL (H)). Liver Function Tests: Recent Labs  Lab 09/19/24 0611  AST 61*  ALT 79*  ALKPHOS 259*  BILITOT 0.6  PROT 6.1*  ALBUMIN 2.8*   No results for input(s): LIPASE, AMYLASE in the last 168 hours. No results for input(s): AMMONIA in the last 168 hours. Coagulation Profile: No results for input(s): INR, PROTIME in the last 168 hours. Cardiac Enzymes: No results for input(s): CKTOTAL, CKMB, CKMBINDEX, TROPONINI in the last 168 hours. BNP (last 3 results) No results for input(s): PROBNP in the last 8760 hours. HbA1C: No results for input(s): HGBA1C in the last 72 hours. CBG: No results  for input(s): GLUCAP in the last 168 hours. Lipid Profile: No results for input(s): CHOL, HDL, LDLCALC, TRIG, CHOLHDL, LDLDIRECT in the last 72 hours. Thyroid Function Tests: No results for input(s): TSH, T4TOTAL, FREET4, T3FREE, THYROIDAB in the last 72 hours. Anemia Panel: No results for input(s): VITAMINB12, FOLATE, FERRITIN, TIBC, IRON, RETICCTPCT in the last 72 hours. Urine analysis: No results found for: COLORURINE, APPEARANCEUR, LABSPEC, PHURINE, GLUCOSEU, HGBUR, BILIRUBINUR, KETONESUR, PROTEINUR, UROBILINOGEN, NITRITE, LEUKOCYTESUR  Radiological Exams on Admission: CT ABDOMEN PELVIS W CONTRAST Result Date: 09/19/2024 EXAM: CT ABDOMEN AND PELVIS WITH CONTRAST 09/19/2024 07:52:02 AM TECHNIQUE: CT of the abdomen and pelvis was performed with the administration of 80 mL of iohexol  (OMNIPAQUE ) 300 MG/ML solution. Multiplanar reformatted images are provided for review. Automated exposure control, iterative reconstruction, and/or weight-based adjustment of the mA/kV was utilized to reduce the radiation dose to as low as reasonably achievable. COMPARISON: CT abdomen 03/27/2021. CLINICAL HISTORY: 84 year old female. LLQ abdominal pain and diarrhea for 5 days. FINDINGS: LOWER CHEST: Suggestion of chronic staple line in the right middle lobe on previous CT. Increased peribronchial but curvilinear and platelike opacity in the right middle lobe most resembling atelectasis. Small new layering right pleural effusion with simple fluid density. No pericardial effusion. Mild bilateral lower lobe atelectasis has also increased. LIVER: Periportal edema might also be present in addition to the intrahepatic dilated bile ducts. No discrete liver lesion. GALLBLADDER AND BILE DUCTS: Chronic cholecystectomy. Intra and extrahepatic biliary ductal dilatation appears diffusely progressed since 2022. The common bile duct now is up to 11 mm diameter (coronal image  38), however, seems to taper distally with no discrete ductal defect by CT (coronal image 42). SPLEEN: No acute abnormality. PANCREAS: No acute abnormality. ADRENAL GLANDS: No acute abnormality. KIDNEYS, URETERS AND BLADDER: Early contrast excretion from both kidneys on the delayed images. No stones in the kidneys or ureters. No hydronephrosis. No perinephric or periureteral stranding. Urinary bladder is unremarkable. GI AND BOWEL: Small fluid level in the distal esophagus which is not significantly dilated. Diminutive and normal appendix series 2 image 54. Severe diverticulosis throughout the large bowel. No active inflammation in the sigmoid colon. Widespread bowel wall thickening and indistinct appearance throughout the ascending and transverse colon, descending colon (series 2 image 48). Nondilated fluid containing small bowel. Decompressed stomach. There is no bowel obstruction. No  pneumoperitoneum. PERITONEUM AND RETROPERITONEUM: Abnormal free fluid in the bilateral pelvis, small volume and simple fluid density. No free air. No pneumoperitoneum. VASCULATURE: Mild infrarenal abdominal aortic aneurysm measures 3 cm diameter on series 2 image 42. Superimposed severe abdominal aortic atherosclerosis with both soft and calcified plaque. Severe bilateral iliac artery atherosclerosis. Major arterial structures and the portal venous system remain patent. LYMPH NODES: No lymphadenopathy. REPRODUCTIVE ORGANS: Negative uterus and adnexa. BONES AND SOFT TISSUES: Ordinary degenerative changes in the spine. No acute osseous abnormality. No focal soft tissue abnormality. IMPRESSION: 1. Widespread acute colitis/diverticulitis. Small volume likely reactive free fluid in the pelvis. No bowel obstruction or perforation. 2. Progressed diffuse intra- and extrahepatic biliary ductal dilatation since 2022 (CBD now 11 mm) with no ductal stone or obstructing etiology by CT. Possible superimposed acute hepatic periportal edema also. 3.  Small new layering right pleural effusion.  Bilateral lung base atelectasis. 4. Mild infrarenal abdominal aortic aneurysm (3.0 cm), recommend ultrasound follow-up in 3 years. Advanced aortic atherosclerosis. Electronically signed by: Helayne Hurst MD MD 09/19/2024 08:12 AM EST RP Workstation: [id number]    EKG: Independently reviewed.  Sinus rhythm, no acute ST changes.  Assessment/Plan Principal Problem:   Diverticulitis  (please populate well all problems here in Problem List. (For example, if patient is on BP meds at home and you resume or decide to hold them, it is a problem that needs to be her. Same for CAD, COPD, HLD and so on)  Acute colitis/diverticulitis - Continue ceftriaxone  and Flagyl  - Probiotics - Outpatient follow-up with GI for colonoscopy in 6 weeks.  AKI - Likely secondary to GI loss - IV fluid, recheck kidney function tomorrow  Hypokalemia - P.o. replacement - Check magnesium  level  Chronic transaminitis Primary biliary cholangitis - Stable, continue ursodiol   MGUS - Stable  Rheumatoid arthritis - Increase prednisone  dosage for acute events -Outpatient Actemra  HTN - Hold off amlodipine due to increasing peripheral edema -Continue metoprolol    DVT prophylaxis: Lovenox Code Status: Full code Family Communication: Daughter at bedside Disposition Plan: Patient sick with pancolitis requiring IV antibiotics, AKI requiring IV fluids, expect more than 2 midnight hospital stay. Consults called: None Admission status: Telemetry admission   Cort ONEIDA Mana MD [facility] Pager [phone]  09/19/2024, 12:03 PM       [1]  Allergies Allergen Reactions   Simvastatin     Pt unsure of reaction, didn't agree with me   "

## 2024-09-20 DIAGNOSIS — K5792 Diverticulitis of intestine, part unspecified, without perforation or abscess without bleeding: Secondary | ICD-10-CM

## 2024-09-20 LAB — BASIC METABOLIC PANEL WITH GFR
Anion gap: 12 (ref 5–15)
BUN: 24 mg/dL — ABNORMAL HIGH (ref 8–23)
CO2: 20 mmol/L — ABNORMAL LOW (ref 22–32)
Calcium: 8.5 mg/dL — ABNORMAL LOW (ref 8.9–10.3)
Chloride: 107 mmol/L (ref 98–111)
Creatinine, Ser: 1.22 mg/dL — ABNORMAL HIGH (ref 0.44–1.00)
GFR, Estimated: 44 mL/min — ABNORMAL LOW
Glucose, Bld: 73 mg/dL (ref 70–99)
Potassium: 3.2 mmol/L — ABNORMAL LOW (ref 3.5–5.1)
Sodium: 138 mmol/L (ref 135–145)

## 2024-09-20 LAB — C DIFFICILE QUICK SCREEN W PCR REFLEX
C Diff antigen: NEGATIVE
C Diff interpretation: NOT DETECTED
C Diff toxin: NEGATIVE

## 2024-09-20 LAB — CBC
HCT: 34 % — ABNORMAL LOW (ref 36.0–46.0)
Hemoglobin: 10.9 g/dL — ABNORMAL LOW (ref 12.0–15.0)
MCH: 29.6 pg (ref 26.0–34.0)
MCHC: 32.1 g/dL (ref 30.0–36.0)
MCV: 92.4 fL (ref 80.0–100.0)
Platelets: 92 K/uL — ABNORMAL LOW (ref 150–400)
RBC: 3.68 MIL/uL — ABNORMAL LOW (ref 3.87–5.11)
RDW: 16.3 % — ABNORMAL HIGH (ref 11.5–15.5)
WBC: 13.3 K/uL — ABNORMAL HIGH (ref 4.0–10.5)
nRBC: 0 % (ref 0.0–0.2)

## 2024-09-20 MED ORDER — MAGNESIUM SULFATE 2 GM/50ML IV SOLN
2.0000 g | Freq: Once | INTRAVENOUS | Status: AC
  Administered 2024-09-20: 2 g via INTRAVENOUS
  Filled 2024-09-20: qty 50

## 2024-09-20 MED ORDER — PREDNISONE 2.5 MG PO TABS: 2.5000 mg | ORAL_TABLET | Freq: Every day | ORAL | Status: DC

## 2024-09-20 MED ORDER — LACTATED RINGERS IV SOLN: INTRAVENOUS | Status: DC

## 2024-09-20 MED ORDER — POTASSIUM CHLORIDE CRYS ER 20 MEQ PO TBCR
40.0000 meq | EXTENDED_RELEASE_TABLET | Freq: Once | ORAL | Status: AC
  Administered 2024-09-20: 40 meq via ORAL
  Filled 2024-09-20: qty 2

## 2024-09-20 MED ORDER — PREDNISONE 10 MG PO TABS
5.0000 mg | ORAL_TABLET | Freq: Every day | ORAL | Status: DC
  Administered 2024-09-21 – 2024-09-25 (×5): 5 mg via ORAL
  Filled 2024-09-20 (×5): qty 1

## 2024-09-20 MED ORDER — CHOLESTYRAMINE 4 G PO PACK
4.0000 g | PACK | Freq: Two times a day (BID) | ORAL | Status: DC
  Administered 2024-09-20 – 2024-09-22 (×5): 4 g via ORAL
  Filled 2024-09-20 (×6): qty 1

## 2024-09-20 MED ORDER — LOPERAMIDE HCL 2 MG PO CAPS: 2.0000 mg | ORAL_CAPSULE | ORAL | Status: DC | PRN

## 2024-09-20 MED ORDER — DIPHENOXYLATE-ATROPINE 2.5-0.025 MG PO TABS
1.0000 | ORAL_TABLET | Freq: Once | ORAL | Status: AC
  Administered 2024-09-20: 1 via ORAL
  Filled 2024-09-20: qty 1

## 2024-09-20 NOTE — Progress Notes (Signed)
 Patient declines to get OOB to [facility] [place] at this time. Patient states that her RA is making her feel weak and stiff. Patient requests to use bedpan at this time.

## 2024-09-20 NOTE — Progress Notes (Signed)
 Bedside RN spoke with Dr. Lawence regarding possible c-diff testing; patient is on antibiotics, white count over 15K/uL, patient has had 7 type 7 stools since 1/10 at 1730, and patient encounter remains below 24 hours at time of request. Orders for imodium  placed.    09/20/24 0400  Provider Notification  Provider Name/Title Dr. Lawence  Date Provider Notified 09/20/24  Time Provider Notified 0400  Method of Notification Page  Notification Reason  (Patient continues to have foul smelling liquid stools)  Provider response See new orders  Date of Provider Response 09/20/24  Time of Provider Response 0410       Latest Ref Rng & Units 09/19/2024    6:05 AM 05/14/2024    9:52 AM 05/28/2023    3:03 PM  CBC  WBC 4.0 - 10.5 K/uL 16.4  9.4  11.9   Hemoglobin 12.0 - 15.0 g/dL 88.6  9.9  88.4   Hematocrit 36.0 - 46.0 % 35.1  32.7  37.1   Platelets 150 - 400 K/uL 134  232  233

## 2024-09-20 NOTE — Plan of Care (Signed)
  Problem: Education: Goal: Knowledge of General Education information will improve Description: Including pain rating scale, medication(s)/side effects and non-pharmacologic comfort measures Outcome: Progressing   Problem: Coping: Goal: Level of anxiety will decrease Outcome: Progressing   Problem: Pain Managment: Goal: General experience of comfort will improve and/or be controlled Outcome: Progressing

## 2024-09-20 NOTE — Progress Notes (Signed)
" °   09/20/24 0400  Notify: Charge Nurse/RN  Name of Charge Nurse/RN Notified Electrical Engineer  Provider Notification  Provider Name/Title Dr. Lawence  Date Provider Notified 09/20/24  Time Provider Notified 0400  Method of Notification Page  Notification Reason  (Patient continues to have foul smelling liquid stools)  Provider response See new orders  Date of Provider Response 09/20/24  Time of Provider Response 0410    "

## 2024-09-20 NOTE — Progress Notes (Signed)
 " PROGRESS NOTE    Sandra [name]  FMW:[id number] DOB: Jan 10, 1941 DOA: 09/19/2024 PCP: Zachary Idelia DELENA, MD    Brief Narrative:  84 y.o. female with medical history significant of rheumatoid arthritis on prednisone  and Actemra, primary biliary cholangitis, MGUS, HTN, presented with worsening of diarrhea.   Symptoms started 5 days ago when patient started to pass watery diarrhea 3-4 times a day without any mucus or blood, he has mild discomfort on lower abdomen but denied any abdominal pain, no nauseous vomiting.  After 3 days, his diarrhea appeared to be improving and started to have formed bowel movement however yesterday evening patient started to pass watery diarrhea again 3 times, when daughter decided to bring him to ED.  She denies any fever chills, no nausea vomiting no urinary symptoms. ED Course: Afebrile, nontachycardic blood pressure 153/50.  CT abdomen pelvis showed diffuse colitis/Colitis, blood work showed WBC 16.4 hemoglobin 11.3 BUN 29 creatinine 1.5 compared to baseline 1.0, sodium 132 potassium 3.3 bicarb 21 AST 61 ALT 79.   Assessment & Plan:   Principal Problem:   Diverticulitis  Acute colitis/diverticulitis Acute diarrhea Patient states nonbloody nonbilious diarrhea increasing in frequency over the past 5 days.  GI PCR panel and C. difficile sent.  C. difficile is negative but GI PCR pending at this time Plan: Continue ceftriaxone  Continue metronidazole  Add Questran  4 g twice daily Continue probiotics Monitor vitals and fever curve   AKI Likely secondary to gastrointestinal loss Improving Continue IV fluids  Hypokalemia Hypomagnesemia In the setting of intractable diarrhea Monitor and replace as needed   Chronic transaminitis Primary biliary cholangitis - Stable, continue ursodiol    MGUS - Stable   Rheumatoid arthritis Continue home prednisone  Patient receives outpatient Actemra monthly Outpatient rheumatology follow-up   HTN Continue  metoprolol  Hold amlodipine  Depression PTA Lexapro     DVT prophylaxis: SQH Code Status: Full Family Communication: Daughter at bedside 1/11 Disposition Plan: Status is: Inpatient Remains inpatient appropriate because: Acute colitis on IV antibiotics   Level of care: Med-Surg  Consultants:  None  Procedures:  None  Antimicrobials: Ceftriaxone  Metronidazole    Subjective: Seen and examined.  Resting in bed.  No visible distress.  No complaints of pain.  Fatigue due to diarrhea.  Objective: Vitals:   09/19/24 2003 09/20/24 0327 09/20/24 0750 09/20/24 0942  BP: (!) 153/53 (!) 136/55 (!) 145/57   Pulse: 78 78 69   Resp: 16 16 (!) 25   Temp: 99 F (37.2 C) 98.4 F (36.9 C) 98 F (36.7 C)   TempSrc:      SpO2: 91% 92% (!) 88% 92%  Weight:      Height:        Intake/Output Summary (Last 24 hours) at 09/20/2024 1140 Last data filed at 09/20/2024 0500 Gross per 24 hour  Intake 801.67 ml  Output 600 ml  Net 201.67 ml   Filed Weights   09/19/24 0602  Weight: 78.5 kg    Examination:  General exam: NAD Respiratory system: Clear to auscultation. Respiratory effort normal. Cardiovascular system: S1-S2, RRR, no murmurs, no pedal edema Gastrointestinal system: Soft, NT/ND, normal bowel sounds Central nervous system: Alert and oriented. No focal neurological deficits. Extremities: Symmetric 5 x 5 power. Skin: No rashes, lesions or ulcers Psychiatry: Judgement and insight appear normal. Mood & affect appropriate.     Data Reviewed: I have personally reviewed following labs and imaging studies  CBC: Recent Labs  Lab 09/19/24 0605 09/20/24 0647  WBC 16.4* 13.3*  NEUTROABS 14.8*  --  HGB 11.3* 10.9*  HCT 35.1* 34.0*  MCV 92.9 92.4  PLT 134* 92*   Basic Metabolic Panel: Recent Labs  Lab 09/19/24 0611 09/19/24 1954 09/20/24 0647  NA 132*  --  138  K 3.3*  --  3.2*  CL 98  --  107  CO2 21*  --  20*  GLUCOSE 103*  --  73  BUN 29*  --  24*   CREATININE 1.54*  --  1.22*  CALCIUM 9.0  --  8.5*  MG  --  1.4*  --   PHOS  --  2.5  --    GFR: Estimated Creatinine Clearance: 37.6 mL/min (A) (by C-G formula based on SCr of 1.22 mg/dL (H)). Liver Function Tests: Recent Labs  Lab 09/19/24 0611  AST 61*  ALT 79*  ALKPHOS 259*  BILITOT 0.6  PROT 6.1*  ALBUMIN 2.8*   No results for input(s): LIPASE, AMYLASE in the last 168 hours. No results for input(s): AMMONIA in the last 168 hours. Coagulation Profile: No results for input(s): INR, PROTIME in the last 168 hours. Cardiac Enzymes: No results for input(s): CKTOTAL, CKMB, CKMBINDEX, TROPONINI in the last 168 hours. BNP (last 3 results) No results for input(s): PROBNP in the last 8760 hours. HbA1C: No results for input(s): HGBA1C in the last 72 hours. CBG: No results for input(s): GLUCAP in the last 168 hours. Lipid Profile: No results for input(s): CHOL, HDL, LDLCALC, TRIG, CHOLHDL, LDLDIRECT in the last 72 hours. Thyroid Function Tests: No results for input(s): TSH, T4TOTAL, FREET4, T3FREE, THYROIDAB in the last 72 hours. Anemia Panel: No results for input(s): VITAMINB12, FOLATE, FERRITIN, TIBC, IRON, RETICCTPCT in the last 72 hours. Sepsis Labs: No results for input(s): PROCALCITON, LATICACIDVEN in the last 168 hours.  Recent Results (from the past 240 hours)  Resp panel by RT-PCR (RSV, Flu A&B, Covid) Anterior Nasal Swab     Status: None   Collection Time: 09/19/24  6:11 AM   Specimen: Anterior Nasal Swab  Result Value Ref Range Status   SARS Coronavirus 2 by RT PCR NEGATIVE NEGATIVE Final    Comment: (NOTE) SARS-CoV-2 target nucleic acids are NOT DETECTED.  The SARS-CoV-2 RNA is generally detectable in upper respiratory specimens during the acute phase of infection. The lowest concentration of SARS-CoV-2 viral copies this assay can detect is 138 copies/mL. A negative result does not preclude  SARS-Cov-2 infection and should not be used as the sole basis for treatment or other patient management decisions. A negative result may occur with  improper specimen collection/handling, submission of specimen other than nasopharyngeal swab, presence of viral mutation(s) within the areas targeted by this assay, and inadequate number of viral copies(<138 copies/mL). A negative result must be combined with clinical observations, patient history, and epidemiological information. The expected result is Negative.  Fact Sheet for Patients:  bloggercourse.com  Fact Sheet for Healthcare Providers:  seriousbroker.it  This test is no t yet approved or cleared by the [place]  FDA and  has been authorized for detection and/or diagnosis of SARS-CoV-2 by FDA under an Emergency Use Authorization (EUA). This EUA will remain  in effect (meaning this test can be used) for the duration of the COVID-19 declaration under Section 564(b)(1) of the Act, 21 U.S.C.section 360bbb-3(b)(1), unless the authorization is terminated  or revoked sooner.       Influenza A by PCR NEGATIVE NEGATIVE Final   Influenza B by PCR NEGATIVE NEGATIVE Final    Comment: (NOTE) The Xpert Xpress SARS-CoV-2/FLU/RSV plus  assay is intended as an aid in the diagnosis of influenza from Nasopharyngeal swab specimens and should not be used as a sole basis for treatment. Nasal washings and aspirates are unacceptable for Xpert Xpress SARS-CoV-2/FLU/RSV testing.  Fact Sheet for Patients: bloggercourse.com  Fact Sheet for Healthcare Providers: seriousbroker.it  This test is not yet approved or cleared by the [place]  FDA and has been authorized for detection and/or diagnosis of SARS-CoV-2 by FDA under an Emergency Use Authorization (EUA). This EUA will remain in effect (meaning this test can be used) for the duration of  the COVID-19 declaration under Section 564(b)(1) of the Act, 21 U.S.C. section 360bbb-3(b)(1), unless the authorization is terminated or revoked.     Resp Syncytial Virus by PCR NEGATIVE NEGATIVE Final    Comment: (NOTE) Fact Sheet for Patients: bloggercourse.com  Fact Sheet for Healthcare Providers: seriousbroker.it  This test is not yet approved or cleared by the [place]  FDA and has been authorized for detection and/or diagnosis of SARS-CoV-2 by FDA under an Emergency Use Authorization (EUA). This EUA will remain in effect (meaning this test can be used) for the duration of the COVID-19 declaration under Section 564(b)(1) of the Act, 21 U.S.C. section 360bbb-3(b)(1), unless the authorization is terminated or revoked.  Performed at [facility], [address] [place], [place] [address]   C Difficile Quick Screen w PCR reflex     Status: None   Collection Time: 09/20/24  7:18 AM   Specimen: STOOL  Result Value Ref Range Status   C Diff antigen NEGATIVE NEGATIVE Final   C Diff toxin NEGATIVE NEGATIVE Final   C Diff interpretation No C. difficile detected.  Final    Comment: Performed at [facility], [address] [place], [place] [address]         Radiology Studies: CT ABDOMEN PELVIS W CONTRAST Result Date: 09/19/2024 EXAM: CT ABDOMEN AND PELVIS WITH CONTRAST 09/19/2024 07:52:02 AM TECHNIQUE: CT of the abdomen and pelvis was performed with the administration of 80 mL of iohexol  (OMNIPAQUE ) 300 MG/ML solution. Multiplanar reformatted images are provided for review. Automated exposure control, iterative reconstruction, and/or weight-based adjustment of the mA/kV was utilized to reduce the radiation dose to as low as reasonably achievable. COMPARISON: CT abdomen 03/27/2021. CLINICAL HISTORY: 84 year old female. LLQ abdominal pain and diarrhea for 5 days. FINDINGS: LOWER CHEST: Suggestion of chronic  staple line in the right middle lobe on previous CT. Increased peribronchial but curvilinear and platelike opacity in the right middle lobe most resembling atelectasis. Small new layering right pleural effusion with simple fluid density. No pericardial effusion. Mild bilateral lower lobe atelectasis has also increased. LIVER: Periportal edema might also be present in addition to the intrahepatic dilated bile ducts. No discrete liver lesion. GALLBLADDER AND BILE DUCTS: Chronic cholecystectomy. Intra and extrahepatic biliary ductal dilatation appears diffusely progressed since 2022. The common bile duct now is up to 11 mm diameter (coronal image 38), however, seems to taper distally with no discrete ductal defect by CT (coronal image 42). SPLEEN: No acute abnormality. PANCREAS: No acute abnormality. ADRENAL GLANDS: No acute abnormality. KIDNEYS, URETERS AND BLADDER: Early contrast excretion from both kidneys on the delayed images. No stones in the kidneys or ureters. No hydronephrosis. No perinephric or periureteral stranding. Urinary bladder is unremarkable. GI AND BOWEL: Small fluid level in the distal esophagus which is not significantly dilated. Diminutive and normal appendix series 2 image 54. Severe diverticulosis throughout the large bowel. No active inflammation in the  sigmoid colon. Widespread bowel wall thickening and indistinct appearance throughout the ascending and transverse colon, descending colon (series 2 image 48). Nondilated fluid containing small bowel. Decompressed stomach. There is no bowel obstruction. No pneumoperitoneum. PERITONEUM AND RETROPERITONEUM: Abnormal free fluid in the bilateral pelvis, small volume and simple fluid density. No free air. No pneumoperitoneum. VASCULATURE: Mild infrarenal abdominal aortic aneurysm measures 3 cm diameter on series 2 image 42. Superimposed severe abdominal aortic atherosclerosis with both soft and calcified plaque. Severe bilateral iliac artery  atherosclerosis. Major arterial structures and the portal venous system remain patent. LYMPH NODES: No lymphadenopathy. REPRODUCTIVE ORGANS: Negative uterus and adnexa. BONES AND SOFT TISSUES: Ordinary degenerative changes in the spine. No acute osseous abnormality. No focal soft tissue abnormality. IMPRESSION: 1. Widespread acute colitis/diverticulitis. Small volume likely reactive free fluid in the pelvis. No bowel obstruction or perforation. 2. Progressed diffuse intra- and extrahepatic biliary ductal dilatation since 2022 (CBD now 11 mm) with no ductal stone or obstructing etiology by CT. Possible superimposed acute hepatic periportal edema also. 3. Small new layering right pleural effusion.  Bilateral lung base atelectasis. 4. Mild infrarenal abdominal aortic aneurysm (3.0 cm), recommend ultrasound follow-up in 3 years. Advanced aortic atherosclerosis. Electronically signed by: Helayne Hurst MD MD 09/19/2024 08:12 AM EST RP Workstation: [id number]        Scheduled Meds:  acidophilus  1 capsule Oral TID   busPIRone   7.5 mg Oral BID   cholestyramine   4 g Oral BID   escitalopram   10 mg Oral Daily   heparin   5,000 Units Subcutaneous Q12H   metoprolol  tartrate  25 mg Oral BID   pantoprazole   40 mg Oral Daily   pravastatin   20 mg Oral Daily   [START ON 09/21/2024] predniSONE   2.5 mg Oral Q breakfast   ursodiol   300 mg Oral TID   Continuous Infusions:  cefTRIAXone  (ROCEPHIN )  IV 2 g (09/20/24 1055)   metronidazole  500 mg (09/20/24 0934)     LOS: 1 day     Calvin KATHEE Robson, MD [facility]   If 7PM-7AM, please contact night-coverage  09/20/2024, 11:40 AM   "

## 2024-09-20 NOTE — Evaluation (Signed)
 Physical Therapy Evaluation Patient Details Name: Sandra [name] MRN: [id number] DOB: [birth year] Today's Date: 09/20/2024  History of Present Illness  84 y/o female presented to ED on 09/19/24 for worsening diarrhea. Admitted for acute colitis/diverticulitis. PMH: RA, HTN  Clinical Impression  Patient admitted with the above. PTA, patient lives alone and was ambulatory with use of rollator. She has a retired [employer] friend that assists with cleaning and ADLs as needed. Patient presents with weakness, impaired balance, and decreased activity tolerance. Able to complete bed mobility modI. Stood from low bed surface with minA and transferred to [facility] with RW and CGA. Stood from slightly elevated BSC with CGA. Ambulated ~25' with RW and CGA within room, however patient demonstrates 2/4 DOE with spO2 >89%. Encouraged patient to continue getting up for toileting regularly and ambulating to door or bathroom 2-3 more times this date, patient and daughter verbalized understanding. Patient will benefit from skilled PT services during acute stay to address listed deficits. Patient will benefit from ongoing therapy at discharge to maximize functional independence and safety.         If plan is discharge home, recommend the following: A little help with walking and/or transfers;A little help with bathing/dressing/bathroom;Assistance with cooking/housework;Assist for transportation;Help with stairs or ramp for entrance   Can travel by private vehicle   Yes    Equipment Recommendations Rolling Iesha Summerhill (2 wheels);BSC/3in1  Recommendations for Other Services  OT consult    Functional Status Assessment Patient has had a recent decline in their functional status and demonstrates the ability to make significant improvements in function in a reasonable and predictable amount of time.     Precautions / Restrictions Precautions Precautions: Fall Recall of Precautions/Restrictions: Intact Restrictions Weight Bearing  Restrictions Per Provider Order: No      Mobility  Bed Mobility Overal bed mobility: Modified Independent                  Transfers Overall transfer level: Needs assistance Equipment used: Rolling Zygmunt Mcglinn (2 wheels) Transfers: Sit to/from Stand Sit to Stand: Min assist, Contact guard assist           General transfer comment: minA from low surface. CGA from slightly elevated BSC    Ambulation/Gait Ambulation/Gait assistance: Contact guard assist Gait Distance (Feet): 25 Feet Assistive device: Rolling Daniele Dillow (2 wheels) Gait Pattern/deviations: Step-through pattern, Decreased stride length Gait velocity: decreased     General Gait Details: CGA for safety. 2/4 DOE with spO2 >89%  Stairs            Wheelchair Mobility     Tilt Bed    Modified Rankin (Stroke Patients Only)       Balance Overall balance assessment: Needs assistance Sitting-balance support: No upper extremity supported, Feet supported Sitting balance-Leahy Scale: Normal     Standing balance support: Bilateral upper extremity supported, Reliant on assistive device for balance Standing balance-Leahy Scale: Fair                               Pertinent Vitals/Pain Pain Assessment Pain Assessment: Faces Faces Pain Scale: No hurt Pain Intervention(s): Monitored during session    Home Living Family/patient expects to be discharged to:: Private residence Living Arrangements: Alone Available Help at Discharge: Family;Available 24 hours/day Type of Home: House Home Access: Ramped entrance       Home Layout: One level Home Equipment: Rollator (4 wheels);Cane - single point;Shower seat  Prior Function Prior Level of Function : Needs assist             Mobility Comments: ambulates with rollator, denies falls ADLs Comments: She has a friend who is a retired [employer] that assists with ADLs and cleaning     Extremity/Trunk Assessment   Upper Extremity  Assessment Upper Extremity Assessment: Generalized weakness (hx of RA in B hands)    Lower Extremity Assessment Lower Extremity Assessment: Generalized weakness    Cervical / Trunk Assessment Cervical / Trunk Assessment: Kyphotic  Communication   Communication Communication: No apparent difficulties    Cognition Arousal: Alert Behavior During Therapy: WFL for tasks assessed/performed   PT - Cognitive impairments: No apparent impairments                         Following commands: Intact       Cueing       General Comments      Exercises     Assessment/Plan    PT Assessment Patient needs continued PT services  PT Problem List Decreased strength;Decreased activity tolerance;Decreased balance;Decreased mobility;Cardiopulmonary status limiting activity       PT Treatment Interventions DME instruction;Gait training;Functional mobility training;Therapeutic activities;Therapeutic exercise;Neuromuscular re-education;Balance training;Patient/family education    PT Goals (Current goals can be found in the Care Plan section)  Acute Rehab PT Goals Patient Stated Goal: to go home when ready PT Goal Formulation: With patient/family Time For Goal Achievement: 10/04/24 Potential to Achieve Goals: Good    Frequency Min 3X/week     Co-evaluation               AM-PAC PT 6 Clicks Mobility  Outcome Measure Help needed turning from your back to your side while in a flat bed without using bedrails?: None Help needed moving from lying on your back to sitting on the side of a flat bed without using bedrails?: None Help needed moving to and from a bed to a chair (including a wheelchair)?: A Little Help needed standing up from a chair using your arms (e.g., wheelchair or bedside chair)?: A Little Help needed to walk in hospital room?: A Little Help needed climbing 3-5 steps with a railing? : A Little 6 Click Score: 20    End of Session   Activity Tolerance:  Patient tolerated treatment well Patient left: in bed;with call bell/phone within reach;with bed alarm set;with family/visitor present Nurse Communication: Mobility status PT Visit Diagnosis: Unsteadiness on feet (R26.81);Muscle weakness (generalized) (M62.81);Difficulty in walking, not elsewhere classified (R26.2)    Time: 9049-8981 PT Time Calculation (min) (ACUTE ONLY): 28 min   Charges:   PT Evaluation $PT Eval Moderate Complexity: 1 Mod   PT General Charges $$ ACUTE PT VISIT: 1 Visit         Maryanne Finder, PT, DPT Physical Therapist - [facility]  [facility]   Sheronda Parran A Krysta Bloomfield 09/20/2024, 10:40 AM

## 2024-09-20 NOTE — Progress Notes (Signed)
 Patient had 8 loose foul smelling stools throughout the night. Dr. Lawence and [facility] Debby notified  this evening.

## 2024-09-21 DIAGNOSIS — K5792 Diverticulitis of intestine, part unspecified, without perforation or abscess without bleeding: Secondary | ICD-10-CM | POA: Diagnosis not present

## 2024-09-21 LAB — CBC WITH DIFFERENTIAL/PLATELET
Abs Immature Granulocytes: 0.19 K/uL — ABNORMAL HIGH (ref 0.00–0.07)
Basophils Absolute: 0.1 K/uL (ref 0.0–0.1)
Basophils Relative: 1 %
Eosinophils Absolute: 0 K/uL (ref 0.0–0.5)
Eosinophils Relative: 0 %
HCT: 33.3 % — ABNORMAL LOW (ref 36.0–46.0)
Hemoglobin: 10.8 g/dL — ABNORMAL LOW (ref 12.0–15.0)
Immature Granulocytes: 1 %
Lymphocytes Relative: 4 %
Lymphs Abs: 0.8 K/uL (ref 0.7–4.0)
MCH: 29 pg (ref 26.0–34.0)
MCHC: 32.4 g/dL (ref 30.0–36.0)
MCV: 89.5 fL (ref 80.0–100.0)
Monocytes Absolute: 0.5 K/uL (ref 0.1–1.0)
Monocytes Relative: 2 %
Neutro Abs: 18.5 K/uL — ABNORMAL HIGH (ref 1.7–7.7)
Neutrophils Relative %: 92 %
Platelets: 199 K/uL (ref 150–400)
RBC: 3.72 MIL/uL — ABNORMAL LOW (ref 3.87–5.11)
RDW: 16.2 % — ABNORMAL HIGH (ref 11.5–15.5)
Smear Review: NORMAL
WBC: 20 K/uL — ABNORMAL HIGH (ref 4.0–10.5)
nRBC: 0 % (ref 0.0–0.2)

## 2024-09-21 LAB — BASIC METABOLIC PANEL WITH GFR
Anion gap: 10 (ref 5–15)
BUN: 19 mg/dL (ref 8–23)
CO2: 19 mmol/L — ABNORMAL LOW (ref 22–32)
Calcium: 8.4 mg/dL — ABNORMAL LOW (ref 8.9–10.3)
Chloride: 104 mmol/L (ref 98–111)
Creatinine, Ser: 1.18 mg/dL — ABNORMAL HIGH (ref 0.44–1.00)
GFR, Estimated: 46 mL/min — ABNORMAL LOW
Glucose, Bld: 98 mg/dL (ref 70–99)
Potassium: 4 mmol/L (ref 3.5–5.1)
Sodium: 133 mmol/L — ABNORMAL LOW (ref 135–145)

## 2024-09-21 MED ORDER — ALUM & MAG HYDROXIDE-SIMETH 200-200-20 MG/5ML PO SUSP
30.0000 mL | Freq: Four times a day (QID) | ORAL | Status: DC | PRN
  Administered 2024-09-21: 30 mL via ORAL
  Filled 2024-09-21: qty 30

## 2024-09-21 MED ORDER — PSYLLIUM 95 % PO PACK
1.0000 | PACK | Freq: Every day | ORAL | Status: DC
  Administered 2024-09-21: 1 via ORAL
  Filled 2024-09-21 (×3): qty 1

## 2024-09-21 MED ORDER — ENSURE PLUS HIGH PROTEIN PO LIQD
237.0000 mL | Freq: Two times a day (BID) | ORAL | Status: DC
  Administered 2024-09-21 – 2024-09-25 (×7): 237 mL via ORAL

## 2024-09-21 NOTE — Evaluation (Signed)
 Occupational Therapy Evaluation Patient Details Name: Sandra [name] MRN: [id number] DOB: 09/02/1941 Today's Date: 09/21/2024   History of Present Illness   84 y/o female presented to ED on 09/19/24 for worsening diarrhea. Admitted for acute colitis/diverticulitis. PMH: RA, HTN     Clinical Impressions Pt was seen for OT evaluation this date. PTA, pt lives at home alone and has a friend (retired [employer]) who assists a few hours per day with IADLs/cleaning and provides supervision during showering. Pt is able to cook some. Sandra [name] takes her to MD appts. Pt presents with deficits in strength, activity tolerance and balance limiting their ability to perform ADL management at baseline level. Pt currently requires MOD I using bed features for supine to sit and Min/mod A for BLE management and repositioning assist. Required Min A to stand from lowest bed height with cues for [name]/feet placement and anterior weight shifting. Pt stood ~5 mins to perform standing grooming tasks with CGA and BUE support on sink counter before requiring seated rest break. Pt only tolerated ~8 ft ambulation before fatigue using RW with CGA. Pt is far from her baseline at this time. Will follow acutely and require follow up OT services upon DC.     If plan is discharge home, recommend the following:   A lot of help with bathing/dressing/bathroom;A little help with walking and/or transfers;Help with stairs or ramp for entrance;Assist for transportation;Assistance with cooking/housework     Functional Status Assessment   Patient has had a recent decline in their functional status and demonstrates the ability to make significant improvements in function in a reasonable and predictable amount of time.     Equipment Recommendations   Other (comment) (defer to next venue)     Recommendations for Other Services         Precautions/Restrictions   Precautions Precautions: Fall Recall of Precautions/Restrictions:  Intact Restrictions Weight Bearing Restrictions Per Provider Order: No     Mobility Bed Mobility Overal bed mobility: Needs Assistance Bed Mobility: Supine to Sit, Sit to Supine     Supine to sit: Modified independent (Device/Increase time) Sit to supine: Min assist, Mod assist   General bed mobility comments: assist for BLE management and positioning upon return to supine at end of session    Transfers Overall transfer level: Needs assistance Equipment used: Rolling walker (2 wheels) Transfers: Sit to/from Stand Sit to Stand: Min assist, Contact guard assist           General transfer comment: Min A from lowest bed height and CGA from elevated bed during session, increased time and effort; only able to ambulate ~8 ft before fatigue      Balance Overall balance assessment: Needs assistance Sitting-balance support: No upper extremity supported, Feet supported Sitting balance-Leahy Scale: Normal     Standing balance support: Bilateral upper extremity supported, Reliant on assistive device for balance Standing balance-Leahy Scale: Fair Standing balance comment: RW + CGA                           ADL either performed or assessed with clinical judgement   ADL Overall ADL's : Needs assistance/impaired     Grooming: Wash/dry face;Wash/dry hands;Oral care;Standing;Contact guard assist Grooming Details (indicate cue type and reason): leaning onto sink counter with BUEs, fatigued with increased standing time  Functional mobility during ADLs: Contact guard assist;Minimal assistance;Rolling walker (2 wheels)       Vision         Perception         Praxis         Pertinent Vitals/Pain Pain Assessment Pain Assessment: No/denies pain Pain Intervention(s): Monitored during session     Extremity/Trunk Assessment Upper Extremity Assessment Upper Extremity Assessment: Generalized weakness (RA, wears compression  glove on L [name] for trigger finger; limited shoulder ROM to ~90 degrees/can't lift overhead)   Lower Extremity Assessment Lower Extremity Assessment: Generalized weakness   Cervical / Trunk Assessment Cervical / Trunk Assessment: Kyphotic   Communication Communication Communication: No apparent difficulties   Cognition Arousal: Alert Behavior During Therapy: WFL for tasks assessed/performed                                 Following commands: Intact       Cueing  General Comments      fatigues easily, HR stable   Exercises Other Exercises Other Exercises: Edu on role of OT in acute setting and DC recommendations with pt and Sandra [name].   Shoulder Instructions      Home Living Family/patient expects to be discharged to:: Private residence Living Arrangements: Alone Available Help at Discharge: Family;Available 24 hours/day Type of Home: House Home Access: Ramped entrance     Home Layout: One level     Bathroom Shower/Tub: Producer, Television/film/video: Handicapped height     Home Equipment: Rollator (4 wheels);Cane - single point;Shower seat;Grab bars - tub/shower;Grab bars - toilet          Prior Functioning/Environment Prior Level of Function : Needs assist             Mobility Comments: ambulates with rollator, denies falls ADLs Comments: She has a friend who is a retired [employer] that assists with IADLs/cleaning; dtr also nearby and takes her to appts; friend is in the home when she showers    OT Problem List: Decreased strength;Decreased activity tolerance;Impaired balance (sitting and/or standing)   OT Treatment/Interventions: Self-care/ADL training;Therapeutic exercise;Patient/family education;Balance training;Energy conservation;Therapeutic activities;DME and/or AE instruction      OT Goals(Current goals can be found in the care plan section)   Acute Rehab OT Goals Patient Stated Goal: get stronger OT Goal Formulation: With  patient Time For Goal Achievement: 10/05/24 Potential to Achieve Goals: Fair ADL Goals Pt Will Perform Upper Body Bathing: sitting;with supervision Pt Will Perform Lower Body Dressing: with min assist;sitting/lateral leans;sit to/from stand Pt Will Transfer to Toilet: with supervision;ambulating Additional ADL Goal #1: Pt will demo 1 learned ECS during ADL performance 2/2 trials to maximize safety/indep and prevent overexertion.   OT Frequency:  Min 2X/week    Co-evaluation              AM-PAC OT 6 Clicks Daily Activity     Outcome Measure Help from another person eating meals?: A Little Help from another person taking care of personal grooming?: A Little Help from another person toileting, which includes using toliet, bedpan, or urinal?: A Lot Help from another person bathing (including washing, rinsing, drying)?: A Lot Help from another person to put on and taking off regular upper body clothing?: A Lot Help from another person to put on and taking off regular lower body clothing?: A Lot 6 Click Score: 14   End of Session Equipment Utilized During Treatment: Gait  belt;Rolling walker (2 wheels) Nurse Communication: Mobility status  Activity Tolerance: Patient tolerated treatment well Patient left: in bed;with call bell/phone within reach;with bed alarm set;with family/visitor present  OT Visit Diagnosis: Other abnormalities of gait and mobility (R26.89);Muscle weakness (generalized) (M62.81)                Time: 9246-9178 OT Time Calculation (min): 28 min Charges:  OT General Charges $OT Visit: 1 Visit OT Evaluation $OT Eval Moderate Complexity: 1 Mod OT Treatments $Self Care/Home Management : 8-22 mins  Tait Balistreri Chrismon, OTR/L  09/21/2024, 9:27 AM   Jasey Cortez E Chrismon 09/21/2024, 9:21 AM

## 2024-09-21 NOTE — Plan of Care (Signed)
" °  Problem: Education: Goal: Knowledge of General Education information will improve Description: Including pain rating scale, medication(s)/side effects and non-pharmacologic comfort measures Outcome: Progressing   Problem: Skin Integrity: Goal: Risk for impaired skin integrity will decrease Outcome: Progressing   Problem: Safety: Goal: Ability to remain free from injury will improve Outcome: Progressing   Problem: Pain Managment: Goal: General experience of comfort will improve and/or be controlled Outcome: Progressing   Problem: Elimination: Goal: Will not experience complications related to urinary retention Outcome: Progressing   Problem: Elimination: Goal: Will not experience complications related to bowel motility Outcome: Progressing   Problem: Nutrition: Goal: Adequate nutrition will be maintained Outcome: Progressing   Problem: Activity: Goal: Risk for activity intolerance will decrease Outcome: Progressing   "

## 2024-09-21 NOTE — [place] FL2 (Signed)
 " [place]  MEDICAID FL2 LEVEL OF CARE FORM     IDENTIFICATION  Patient Name: Sandra [name] Birthdate: 06-02-1941 Sex: female Admission Date (Current Location): 09/19/2024  [facility] and [place] Number:  Chiropodist and Address:  [facility], [address] [place], [place] [address]      Provider Number: [id number]  Attending Physician Name and Address:  Jhonny Calvin NOVAK, MD  Relative Name and Phone Number:  Darice Badder [phone]    Current Level of Care: Hospital Recommended Level of Care: Skilled Nursing Facility Prior Approval Number:    Date Approved/Denied:   PASRR Number: [id number]  Discharge Plan: SNF    Current Diagnoses: Patient Active Problem List   Diagnosis Date Noted   Diverticulitis 09/19/2024   Symptomatic anemia 05/28/2024   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 Yes; Fluid consistency: Thin: Cardiac starting at 01/10 1000)  AMBULATORY STATUS COMMUNICATION OF NEEDS Skin       Normal                       Personal Care Assistance Level of Assistance  Bathing, Feeding, Dressing Bathing Assistance: Limited assistance Feeding assistance: Limited assistance Dressing Assistance: Limited assistance     Functional Limitations Info  Sight, Hearing, Speech Sight Info: Adequate Hearing Info: Adequate Speech Info: Adequate    SPECIAL CARE FACTORS FREQUENCY  PT (By licensed PT), OT (By licensed OT)     PT Frequency: 5x OT Frequency: 5x            Contractures Contractures Info: Not present    Additional Factors Info                  Current Medications (09/21/2024):  This is the current hospital  active medication list Current Facility-Administered Medications  Medication Dose Route Frequency Provider Last Rate Last Admin   acetaminophen  (TYLENOL ) tablet 650 mg  650 mg Oral Q6H PRN Laurita Manor T, MD       Or   acetaminophen  (TYLENOL ) suppository 650 mg  650 mg Rectal Q6H PRN Laurita Manor T, MD       acidophilus (RISAQUAD) capsule 1 capsule  1 capsule Oral TID Laurita Manor T, MD   1 capsule at 09/21/24 1506   busPIRone  (BUSPAR ) tablet 7.5 mg  7.5 mg Oral BID Laurita Manor T, MD   7.5 mg at 09/21/24 0919   cefTRIAXone  (ROCEPHIN ) 2 g in sodium chloride  0.9 % 100 mL IVPB  2 g Intravenous Q24H Laurita Manor T, MD 200 mL/hr at 09/21/24 0919 2 g at 09/21/24 0919   cholestyramine  (QUESTRAN ) packet 4 g  4 g Oral BID Jhonny Calvin B, MD   4 g at 09/21/24 1009   escitalopram  (LEXAPRO ) tablet 10 mg  10 mg Oral Daily Laurita Manor T, MD   10 mg at 09/21/24 1155   feeding supplement (ENSURE PLUS HIGH PROTEIN) liquid 237 mL  237 mL Oral BID BM Sreenath, Sudheer B, MD   237 mL at 09/21/24 1009   Gerhardt's butt cream   Topical QID PRN Mansy, Jan A, MD       heparin  injection 5,000 Units  5,000 Units  Subcutaneous Q12H Laurita Manor T, MD   5,000 Units at 09/21/24 0919   HYDROmorphone  (DILAUDID ) injection 0.5-1 mg  0.5-1 mg Intravenous Q2H PRN Laurita Manor T, MD       metoprolol  tartrate (LOPRESSOR ) tablet 25 mg  25 mg Oral BID Laurita Manor T, MD   25 mg at 09/21/24 0919   metroNIDAZOLE  (FLAGYL ) IVPB 500 mg  500 mg Intravenous Q12H Laurita Manor T, MD 100 mL/hr at 09/21/24 0918 500 mg at 09/21/24 9081   ondansetron  (ZOFRAN ) tablet 4 mg  4 mg Oral Q6H PRN Laurita Manor DASEN, MD       Or   ondansetron  (ZOFRAN ) injection 4 mg  4 mg Intravenous Q6H PRN Laurita Manor T, MD       pantoprazole  (PROTONIX ) EC tablet 40 mg  40 mg Oral Daily Zhang, Ping T, MD   40 mg at 09/21/24 9080   pravastatin  (PRAVACHOL ) tablet 20 mg  20 mg Oral Daily Zhang, Ping T, MD   20 mg at 09/20/24 2124   predniSONE  (DELTASONE ) tablet 5 mg  5 mg Oral Q  breakfast Sreenath, Sudheer B, MD   5 mg at 09/21/24 0919   psyllium (HYDROCIL/METAMUCIL) 1 packet  1 packet Oral Daily Sreenath, Sudheer B, MD   1 packet at 09/21/24 1341   senna-docusate (Senokot-S) tablet 1 tablet  1 tablet Oral QHS PRN Laurita Manor DASEN, MD       traZODone  (DESYREL ) tablet 25 mg  25 mg Oral QHS PRN Laurita Manor T, MD       ursodiol  (ACTIGALL ) capsule 300 mg  300 mg Oral TID Laurita Manor DASEN, MD   300 mg at 09/21/24 1506     Discharge Medications: Please see discharge summary for a list of discharge medications.  Relevant Imaging Results:  Relevant Lab Results:   Additional Information [id number]  Alfonso Rummer, LCSW     "

## 2024-09-21 NOTE — Care Management Important Message (Signed)
 Important Message  Patient Details  Name: Sandra [name] MRN: [id number] Date of Birth: July 16, 1941   Important Message Given:  Yes - Medicare IM     Sam Overbeck W, CMA 09/21/2024, 3:04 PM

## 2024-09-21 NOTE — TOC Initial Note (Signed)
 Transition of Care [facility]) - Initial/Assessment Note    Patient Details  Name: Sandra [name] MRN: [id number] Date of Birth: 05/22/1941  [facility] CM/SW Contact:    Alfonso Rummer, LCSW Phone Number: 09/21/2024, 4:02 PM  Clinical Narrative:                  KEN DELENA Rummer met with patient and daughter in room 217. Pt informed of recommendations for short term rehab. Pt verbalized understanding and reports she is willing to transition to SNF for rehab. Pt and daughter reports their preferences are [facility], [facility] and [facility]. FL2 completed and bed request are submitted        Patient Goals and CMS Choice            Expected Discharge Plan and Services                                              Prior Living Arrangements/Services                       Activities of Daily Living   ADL Screening (condition at time of admission) Independently performs ADLs?: Yes (appropriate for developmental age) Is the patient deaf or have difficulty hearing?: No Does the patient have difficulty seeing, even when wearing glasses/contacts?: No Does the patient have difficulty concentrating, remembering, or making decisions?: No  Permission Sought/Granted                  Emotional Assessment              Admission diagnosis:  Dehydration [E86.0] Diverticulitis [K57.92] Left lower quadrant pain [R10.32] Other fatigue [R53.83] Diarrhea, unspecified type [R19.7] Patient Active Problem List   Diagnosis Date Noted   Diverticulitis 09/19/2024   Symptomatic anemia 05/28/2024   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 [place] [address] Phone: [phone] Fax: [phone]     Social Drivers of Health (SDOH) Social History: SDOH Screenings   Food Insecurity: No Food  Insecurity (09/19/2024)  Housing: Low Risk (09/19/2024)  Transportation Needs: No Transportation Needs (09/19/2024)  Utilities: Not At Risk (09/19/2024)  Depression (PHQ2-9): Low Risk (05/28/2024)  Financial Resource Strain: Low Risk  (08/11/2024)   Received from [facility]  Social Connections: Moderately Integrated (09/19/2024)  Tobacco Use: Medium Risk (09/19/2024)   SDOH Interventions:     Readmission Risk Interventions     No data to display

## 2024-09-21 NOTE — Progress Notes (Signed)
 " PROGRESS NOTE    Sandra [name]  FMW:[id number] DOB: 04/15/1941 DOA: 09/19/2024 PCP: Zachary Idelia DELENA, MD    Brief Narrative:  84 y.o. female with medical history significant of rheumatoid arthritis on prednisone  and Actemra, primary biliary cholangitis, MGUS, HTN, presented with worsening of diarrhea.   Symptoms started 5 days ago when patient started to pass watery diarrhea 3-4 times a day without any mucus or blood, he has mild discomfort on lower abdomen but denied any abdominal pain, no nauseous vomiting.  After 3 days, his diarrhea appeared to be improving and started to have formed bowel movement however yesterday evening patient started to pass watery diarrhea again 3 times, when daughter decided to bring him to ED.  She denies any fever chills, no nausea vomiting no urinary symptoms.  ED Course: Afebrile, nontachycardic blood pressure 153/50.  CT abdomen pelvis showed diffuse colitis/Colitis, blood work showed WBC 16.4 hemoglobin 11.3 BUN 29 creatinine 1.5 compared to baseline 1.0, sodium 132 potassium 3.3 bicarb 21 AST 61 ALT 79.   Assessment & Plan:   Principal Problem:   Diverticulitis  Acute colitis/diverticulitis Acute diarrhea Patient states nonbloody nonbilious diarrhea increasing in frequency over the past 5 days.  GI PCR panel and C. difficile sent.  C. difficile is negative but GI PCR pending at this time Diarrhea has improved Plan: Continue ceftriaxone  Continue metronidazole  Continue Questran  4 g twice daily Continue probiotics Daily psyllium Monitor vitals and fever curve If diarrhea continues to improve anticipate discontinuation of Questran  on 1/13 Consider transition to oral antibiotic in a.m.   AKI Likely secondary to gastrointestinal loss Improving DC IV fluids Encourage p.o. fluid intake  Hypokalemia Hypomagnesemia In the setting of intractable diarrhea Monitor and replace as needed   Chronic transaminitis Primary biliary cholangitis -  Stable, continue ursodiol    MGUS - Stable   Rheumatoid arthritis Continue home prednisone  Patient receives outpatient Actemra monthly Outpatient rheumatology follow-up   HTN Continue metoprolol  Hold amlodipine  Depression PTA Lexapro     DVT prophylaxis: SQH Code Status: Full Family Communication: Daughter at bedside 1/11, via phone 1/12 Disposition Plan: Status is: Inpatient Remains inpatient appropriate because: Acute colitis on IV antibiotics   Level of care: Med-Surg  Consultants:  None  Procedures:  None  Antimicrobials: Ceftriaxone  Metronidazole    Subjective: Seen and examined.  Sitting up in bed.  Reports improvement in shortness of breath and fatigue since admission.  Diarrhea improving.  Objective: Vitals:   09/20/24 1514 09/20/24 2105 09/21/24 0311 09/21/24 0911  BP: (!) 151/66 (!) 155/57 (!) 172/55 (!) 136/53  Pulse: 67 79 70 69  Resp: 20 18 18 19   Temp: 98.8 F (37.1 C) 98.3 F (36.8 C) 98.6 F (37 C) 98.3 F (36.8 C)  TempSrc: Oral   Oral  SpO2: 96% 94% 93% 95%  Weight:      Height:        Intake/Output Summary (Last 24 hours) at 09/21/2024 1156 Last data filed at 09/21/2024 0725 Gross per 24 hour  Intake 1469.69 ml  Output --  Net 1469.69 ml   Filed Weights   09/19/24 0602  Weight: 78.5 kg    Examination:  General exam: No acute distress Respiratory system: Clear to auscultation. Respiratory effort normal. Cardiovascular system: S1-S2, RRR, no murmurs, no pedal edema Gastrointestinal system: Soft, NT/ND, normal bowel sounds Central nervous system: Alert and oriented. No focal neurological deficits. Extremities: Symmetric 5 x 5 power. Skin: No rashes, lesions or ulcers Psychiatry: Judgement and insight appear normal. Mood &  affect appropriate.     Data Reviewed: I have personally reviewed following labs and imaging studies  CBC: Recent Labs  Lab 09/19/24 0605 09/20/24 0647  WBC 16.4* 13.3*  NEUTROABS 14.8*  --    HGB 11.3* 10.9*  HCT 35.1* 34.0*  MCV 92.9 92.4  PLT 134* 92*   Basic Metabolic Panel: Recent Labs  Lab 09/19/24 0611 09/19/24 1954 09/20/24 0647  NA 132*  --  138  K 3.3*  --  3.2*  CL 98  --  107  CO2 21*  --  20*  GLUCOSE 103*  --  73  BUN 29*  --  24*  CREATININE 1.54*  --  1.22*  CALCIUM 9.0  --  8.5*  MG  --  1.4*  --   PHOS  --  2.5  --    GFR: Estimated Creatinine Clearance: 37.6 mL/min (A) (by C-G formula based on SCr of 1.22 mg/dL (H)). Liver Function Tests: Recent Labs  Lab 09/19/24 0611  AST 61*  ALT 79*  ALKPHOS 259*  BILITOT 0.6  PROT 6.1*  ALBUMIN 2.8*   No results for input(s): LIPASE, AMYLASE in the last 168 hours. No results for input(s): AMMONIA in the last 168 hours. Coagulation Profile: No results for input(s): INR, PROTIME in the last 168 hours. Cardiac Enzymes: No results for input(s): CKTOTAL, CKMB, CKMBINDEX, TROPONINI in the last 168 hours. BNP (last 3 results) No results for input(s): PROBNP in the last 8760 hours. HbA1C: No results for input(s): HGBA1C in the last 72 hours. CBG: No results for input(s): GLUCAP in the last 168 hours. Lipid Profile: No results for input(s): CHOL, HDL, LDLCALC, TRIG, CHOLHDL, LDLDIRECT in the last 72 hours. Thyroid Function Tests: No results for input(s): TSH, T4TOTAL, FREET4, T3FREE, THYROIDAB in the last 72 hours. Anemia Panel: No results for input(s): VITAMINB12, FOLATE, FERRITIN, TIBC, IRON, RETICCTPCT in the last 72 hours. Sepsis Labs: No results for input(s): PROCALCITON, LATICACIDVEN in the last 168 hours.  Recent Results (from the past 240 hours)  Resp panel by RT-PCR (RSV, Flu A&B, Covid) Anterior Nasal Swab     Status: None   Collection Time: 09/19/24  6:11 AM   Specimen: Anterior Nasal Swab  Result Value Ref Range Status   SARS Coronavirus 2 by RT PCR NEGATIVE NEGATIVE Final    Comment: (NOTE) SARS-CoV-2 target nucleic  acids are NOT DETECTED.  The SARS-CoV-2 RNA is generally detectable in upper respiratory specimens during the acute phase of infection. The lowest concentration of SARS-CoV-2 viral copies this assay can detect is 138 copies/mL. A negative result does not preclude SARS-Cov-2 infection and should not be used as the sole basis for treatment or other patient management decisions. A negative result may occur with  improper specimen collection/handling, submission of specimen other than nasopharyngeal swab, presence of viral mutation(s) within the areas targeted by this assay, and inadequate number of viral copies(<138 copies/mL). A negative result must be combined with clinical observations, patient history, and epidemiological information. The expected result is Negative.  Fact Sheet for Patients:  bloggercourse.com  Fact Sheet for Healthcare Providers:  seriousbroker.it  This test is no t yet approved or cleared by the [place]  FDA and  has been authorized for detection and/or diagnosis of SARS-CoV-2 by FDA under an Emergency Use Authorization (EUA). This EUA will remain  in effect (meaning this test can be used) for the duration of the COVID-19 declaration under Section 564(b)(1) of the Act, 21 U.S.C.section 360bbb-3(b)(1), unless the authorization  is terminated  or revoked sooner.       Influenza A by PCR NEGATIVE NEGATIVE Final   Influenza B by PCR NEGATIVE NEGATIVE Final    Comment: (NOTE) The Xpert Xpress SARS-CoV-2/FLU/RSV plus assay is intended as an aid in the diagnosis of influenza from Nasopharyngeal swab specimens and should not be used as a sole basis for treatment. Nasal washings and aspirates are unacceptable for Xpert Xpress SARS-CoV-2/FLU/RSV testing.  Fact Sheet for Patients: bloggercourse.com  Fact Sheet for Healthcare Providers: seriousbroker.it  This  test is not yet approved or cleared by the [place]  FDA and has been authorized for detection and/or diagnosis of SARS-CoV-2 by FDA under an Emergency Use Authorization (EUA). This EUA will remain in effect (meaning this test can be used) for the duration of the COVID-19 declaration under Section 564(b)(1) of the Act, 21 U.S.C. section 360bbb-3(b)(1), unless the authorization is terminated or revoked.     Resp Syncytial Virus by PCR NEGATIVE NEGATIVE Final    Comment: (NOTE) Fact Sheet for Patients: bloggercourse.com  Fact Sheet for Healthcare Providers: seriousbroker.it  This test is not yet approved or cleared by the [place]  FDA and has been authorized for detection and/or diagnosis of SARS-CoV-2 by FDA under an Emergency Use Authorization (EUA). This EUA will remain in effect (meaning this test can be used) for the duration of the COVID-19 declaration under Section 564(b)(1) of the Act, 21 U.S.C. section 360bbb-3(b)(1), unless the authorization is terminated or revoked.  Performed at [facility], [address] [place], [place] [address]   C Difficile Quick Screen w PCR reflex     Status: None   Collection Time: 09/20/24  7:18 AM   Specimen: STOOL  Result Value Ref Range Status   C Diff antigen NEGATIVE NEGATIVE Final   C Diff toxin NEGATIVE NEGATIVE Final   C Diff interpretation No C. difficile detected.  Final    Comment: Performed at [facility], [address] [place], [place] [address]         Radiology Studies: No results found.       Scheduled Meds:  acidophilus  1 capsule Oral TID   busPIRone   7.5 mg Oral BID   cholestyramine   4 g Oral BID   escitalopram   10 mg Oral Daily   feeding supplement  237 mL Oral BID BM   heparin   5,000 Units Subcutaneous Q12H   metoprolol  tartrate  25 mg Oral BID   pantoprazole   40 mg Oral Daily   pravastatin   20 mg Oral Daily    predniSONE   5 mg Oral Q breakfast   psyllium  1 packet Oral Daily   ursodiol   300 mg Oral TID   Continuous Infusions:  cefTRIAXone  (ROCEPHIN )  IV 2 g (09/21/24 0919)   metronidazole  500 mg (09/21/24 0918)     LOS: 2 days     Calvin KATHEE Robson, MD [facility]   If 7PM-7AM, please contact night-coverage  09/21/2024, 11:56 AM   "

## 2024-09-21 NOTE — Progress Notes (Signed)
 Mobility Specialist Progress Note:    09/21/24 1053  Mobility  Activity Stood at bedside;Pivoted/transferred to/from [facility]  Level of Assistance Minimal assist, patient does 75% or more  Assistive Device Front wheel walker  Distance Ambulated (ft) 2 ft  Range of Motion/Exercises Active;All extremities  Activity Response Tolerated well  Mobility visit 1 Mobility  Mobility Specialist Start Time (ACUTE ONLY) 1046  Mobility Specialist Stop Time (ACUTE ONLY) 1058  Mobility Specialist Time Calculation (min) (ACUTE ONLY) 12 min   Pt received requesting assistance, required MinA to stand and transfer with RW. Tolerated well, asx throughout. Returned fowlers, all needs met.  Sherrilee Ditty Mobility Specialist Please contact via Special Educational Needs Teacher or  Rehab office at [phone]

## 2024-09-22 DIAGNOSIS — K5792 Diverticulitis of intestine, part unspecified, without perforation or abscess without bleeding: Secondary | ICD-10-CM | POA: Diagnosis not present

## 2024-09-22 LAB — CBC WITH DIFFERENTIAL/PLATELET
Abs Immature Granulocytes: 0.17 K/uL — ABNORMAL HIGH (ref 0.00–0.07)
Basophils Absolute: 0.1 K/uL (ref 0.0–0.1)
Basophils Relative: 0 %
Eosinophils Absolute: 0 K/uL (ref 0.0–0.5)
Eosinophils Relative: 0 %
HCT: 33.2 % — ABNORMAL LOW (ref 36.0–46.0)
Hemoglobin: 10.6 g/dL — ABNORMAL LOW (ref 12.0–15.0)
Immature Granulocytes: 1 %
Lymphocytes Relative: 5 %
Lymphs Abs: 0.9 K/uL (ref 0.7–4.0)
MCH: 29.2 pg (ref 26.0–34.0)
MCHC: 31.9 g/dL (ref 30.0–36.0)
MCV: 91.5 fL (ref 80.0–100.0)
Monocytes Absolute: 0.7 K/uL (ref 0.1–1.0)
Monocytes Relative: 4 %
Neutro Abs: 17 K/uL — ABNORMAL HIGH (ref 1.7–7.7)
Neutrophils Relative %: 90 %
Platelets: 208 K/uL (ref 150–400)
RBC: 3.63 MIL/uL — ABNORMAL LOW (ref 3.87–5.11)
RDW: 16.5 % — ABNORMAL HIGH (ref 11.5–15.5)
WBC: 18.8 K/uL — ABNORMAL HIGH (ref 4.0–10.5)
nRBC: 0 % (ref 0.0–0.2)

## 2024-09-22 LAB — BASIC METABOLIC PANEL WITH GFR
Anion gap: 9 (ref 5–15)
BUN: 18 mg/dL (ref 8–23)
CO2: 22 mmol/L (ref 22–32)
Calcium: 8.9 mg/dL (ref 8.9–10.3)
Chloride: 106 mmol/L (ref 98–111)
Creatinine, Ser: 1.07 mg/dL — ABNORMAL HIGH (ref 0.44–1.00)
GFR, Estimated: 52 mL/min — ABNORMAL LOW
Glucose, Bld: 87 mg/dL (ref 70–99)
Potassium: 3.5 mmol/L (ref 3.5–5.1)
Sodium: 137 mmol/L (ref 135–145)

## 2024-09-22 LAB — GI PATHOGEN PANEL BY PCR, STOOL

## 2024-09-22 LAB — PHOSPHORUS: Phosphorus: 2.7 mg/dL (ref 2.5–4.6)

## 2024-09-22 LAB — MAGNESIUM: Magnesium: 1.8 mg/dL (ref 1.7–2.4)

## 2024-09-22 LAB — PROCALCITONIN: Procalcitonin: 1.13 ng/mL

## 2024-09-22 MED ORDER — PSYLLIUM 95 % PO PACK
1.0000 | PACK | Freq: Two times a day (BID) | ORAL | Status: DC
  Administered 2024-09-22 – 2024-09-25 (×5): 1 via ORAL
  Filled 2024-09-22 (×7): qty 1

## 2024-09-22 NOTE — TOC Progression Note (Signed)
 Transition of Care [facility]) - Progression Note    Patient Details  Name: Sandra [name] MRN: [id number] Date of Birth: 01-19-1941  Transition of [facility] CM/SW Contact  Sandra [name], RN Phone Number: 09/22/2024, 9:57 AM  Clinical Narrative:     Spoke with daughter Sandra [name] who was at beside. Patient defers to daughter to make decision on SNF.  Sandra [name] accepts bed at [facility].  Accepted in HUB and notified Sandra [name] at [facility]                     Expected Discharge Plan and Services                                               Social Drivers of Health (SDOH) Interventions SDOH Screenings   Food Insecurity: No Food Insecurity (09/19/2024)  Housing: Low Risk (09/19/2024)  Transportation Needs: No Transportation Needs (09/19/2024)  Utilities: Not At Risk (09/19/2024)  Depression (PHQ2-9): Low Risk (05/28/2024)  Financial Resource Strain: Low Risk  (08/11/2024)   Received from [facility]  Social Connections: Moderately Integrated (09/19/2024)  Tobacco Use: Medium Risk (09/19/2024)    Readmission Risk Interventions     No data to display

## 2024-09-22 NOTE — Progress Notes (Signed)
 Physical Therapy Treatment Patient Details Name: Sandra [name] MRN: [id number] DOB: September 30, 1940 Today's Date: 09/22/2024   History of Present Illness 84 y/o female presented to ED on 09/19/24 for worsening diarrhea. Admitted for acute colitis/diverticulitis. PMH: RA, HTN    PT Comments  Pt seen for PT tx with pt agreeable with encouragement, education. Pt requires more assistance for limited mobility on this date, pt reporting today is a bad day 2/2 BLE knee pain. Pt is anxious with mobility, PT reassuring her throughout session. Continue to recommend ongoing PT services & post acute rehab <3 hours therapy/day upon d/c.    If plan is discharge home, recommend the following: A little help with walking and/or transfers;A little help with bathing/dressing/bathroom;Assistance with cooking/housework;Assist for transportation;Help with stairs or ramp for entrance   Can travel by private vehicle     Yes  Equipment Recommendations  Rolling walker (2 wheels);BSC/3in1    Recommendations for Other Services       Precautions / Restrictions Precautions Precautions: Fall Restrictions Weight Bearing Restrictions Per Provider Order: No     Mobility  Bed Mobility Overal bed mobility: Needs Assistance Bed Mobility: Supine to Sit     Supine to sit: Mod assist, HOB elevated, Used rails Sit to supine: Mod assist, HOB elevated, Used rails   General bed mobility comments: assistance to move BLE to/from EOB, pt able to scoot to [facility] with bed in trendelenburg position, use of BLE but not UE 2/2 decreased ROM    Transfers Overall transfer level: Needs assistance Equipment used: Rolling walker (2 wheels) Transfers: Sit to/from Stand, Bed to chair/wheelchair/BSC Sit to Stand: Min assist   Step pivot transfers: Min assist (BSC>bed on R)       General transfer comment: sit>Stand from EOB, BSC with cuing re: hand placement, assistance to power up, pt transitions to clearing buttocks to standing  upright with pelvis shifted anteriorly & uprighting trunk with extra time    Ambulation/Gait Ambulation/Gait assistance: Contact guard assist, Min assist Gait Distance (Feet): 5 Feet Assistive device: Rolling walker (2 wheels) Gait Pattern/deviations: Decreased step length - right, Decreased step length - left, Decreased dorsiflexion - right, Decreased dorsiflexion - left, Decreased stride length Gait velocity: decreased         Stairs             Wheelchair Mobility     Tilt Bed    Modified Rankin (Stroke Patients Only)       Balance Overall balance assessment: Needs assistance Sitting-balance support: No upper extremity supported, Feet supported Sitting balance-Leahy Scale: Good     Standing balance support: During functional activity, Bilateral upper extremity supported, Reliant on assistive device for balance Standing balance-Leahy Scale: Fair                              Hotel Manager: No apparent difficulties  Cognition Arousal: Alert Behavior During Therapy: Anxious, WFL for tasks assessed/performed   PT - Cognitive impairments: No apparent impairments                         Following commands: Intact      Cueing Cueing Techniques: Verbal cues  Exercises      General Comments General comments (skin integrity, edema, etc.): small continent BM on BSC, total assist for peri hygiene (unable to let go of RW to attempt to perform peri hygiene on her own, requires BUE  support for balance)      Pertinent Vitals/Pain Pain Assessment Pain Assessment: Faces Faces Pain Scale: Hurts whole lot Pain Location: BLE knees (R>L) Pain Descriptors / Indicators: Grimacing, Discomfort, Aching Pain Intervention(s): Monitored during session, Limited activity within patient's tolerance, Repositioned, Utilized relaxation techniques, Heat applied (pt using heating pads (pt reports redness to R knee at beginning of  session, heat adjusted from 100 degrees F to 50 degrees F & placed sheet between skin & heating pad), redness gone by end of session, nurse made aware)    Home Living                          Prior Function            PT Goals (current goals can now be found in the care plan section) Acute Rehab PT Goals Patient Stated Goal: to go home when ready PT Goal Formulation: With patient/family Time For Goal Achievement: 10/04/24 Potential to Achieve Goals: Fair Progress towards PT goals: Progressing toward goals    Frequency    Min 2X/week      PT Plan      Co-evaluation              AM-PAC PT 6 Clicks Mobility   Outcome Measure  Help needed turning from your back to your side while in a flat bed without using bedrails?: A Little Help needed moving from lying on your back to sitting on the side of a flat bed without using bedrails?: A Lot Help needed moving to and from a bed to a chair (including a wheelchair)?: A Little Help needed standing up from a chair using your arms (e.g., wheelchair or bedside chair)?: A Little Help needed to walk in hospital room?: A Little Help needed climbing 3-5 steps with a railing? : A Lot 6 Click Score: 16    End of Session   Activity Tolerance: Patient limited by pain;Patient limited by fatigue Patient left: in bed;with call bell/phone within reach;with bed alarm set;with family/visitor present Nurse Communication: Mobility status (redness on R knee, adjusting heating pad temp) PT Visit Diagnosis: Unsteadiness on feet (R26.81);Muscle weakness (generalized) (M62.81);Difficulty in walking, not elsewhere classified (R26.2);Pain Pain - Right/Left:  (bilateral) Pain - part of body: Hip;Knee     Time: 1355-1414 PT Time Calculation (min) (ACUTE ONLY): 19 min  Charges:    $Therapeutic Activity: 8-22 mins PT General Charges $$ ACUTE PT VISIT: 1 Visit                     Richerd Pinal, PT, DPT 09/22/2024, 2:21  PM   Richerd CHRISTELLA Pinal 09/22/2024, 2:20 PM

## 2024-09-22 NOTE — Progress Notes (Signed)
 Mobility Specialist Progress Note:    09/22/24 1008  Mobility  Activity Stood with assistance;Pivoted/transferred to/from [facility]  Level of Assistance Minimal assist, patient does 75% or more  Assistive [employer] wheel walker  Range of Motion/Exercises Active;All extremities  Activity Response Tolerated well  Mobility visit 1 Mobility  Mobility Specialist Start Time (ACUTE ONLY) [id number]  Mobility Specialist Stop Time (ACUTE ONLY) 1008  Mobility Specialist Time Calculation (min) (ACUTE ONLY) 17 min   Assisted pt tp BSC, required MinA to stand and transfer with RW. Tolerated well, c/o feeling weak. Returned pt fowlers, alarm on. Daughter at bedside, all needs met.   Sherrilee Ditty Mobility Specialist Please contact via Special Educational Needs Teacher or  Rehab office at [phone]

## 2024-09-22 NOTE — Progress Notes (Signed)
 " PROGRESS NOTE    Sandra [name]  FMW:[id number] DOB: 07-12-1941 DOA: 09/19/2024 PCP: Zachary Idelia DELENA, MD    Brief Narrative:  84 y.o. female with medical history significant of rheumatoid arthritis on prednisone  and Actemra, primary biliary cholangitis, MGUS, HTN, presented with worsening of diarrhea.   Symptoms started 5 days ago when patient started to pass watery diarrhea 3-4 times a day without any mucus or blood, he has mild discomfort on lower abdomen but denied any abdominal pain, no nauseous vomiting.  After 3 days, his diarrhea appeared to be improving and started to have formed bowel movement however yesterday evening patient started to pass watery diarrhea again 3 times, when daughter decided to bring him to ED.  She denies any fever chills, no nausea vomiting no urinary symptoms.  ED Course: Afebrile, nontachycardic blood pressure 153/50.  CT abdomen pelvis showed diffuse colitis/Colitis, blood work showed WBC 16.4 hemoglobin 11.3 BUN 29 creatinine 1.5 compared to baseline 1.0, sodium 132 potassium 3.3 bicarb 21 AST 61 ALT 79.   Assessment & Plan:   Principal Problem:   Diverticulitis  Acute colitis/diverticulitis Acute diarrhea Patient states nonbloody nonbilious diarrhea increasing in frequency over the past 5 days.  GI PCR panel and C. difficile sent.  C. difficile is negative but GI PCR pending at this time Frequency of loose stools has decreased Plan: Continue ceftriaxone  Continue metronidazole  Discontinue Questran  4 g twice daily Increase psyllium to twice daily Continue probiotics Monitor vitals and fever curve Discharge pending improvement in diarrhea   AKI Likely secondary to gastrointestinal loss Improving DC IV fluids Encourage p.o. fluid intake  Hypokalemia Hypomagnesemia In the setting of intractable diarrhea Monitor and replace as needed   Chronic transaminitis Primary biliary cirrhosis - Stable, continue ursodiol    MGUS - Stable    Rheumatoid arthritis Continue home prednisone  Patient receives outpatient Actemra monthly Outpatient rheumatology follow-up   HTN Continue metoprolol  Hold amlodipine  Depression PTA Lexapro     DVT prophylaxis: SQH Code Status: Full Family Communication: Daughter at bedside 1/11, via phone 1/12, at bedside 1/13 Disposition Plan: Status is: Inpatient Remains inpatient appropriate because: Acute colitis on IV antibiotics   Level of care: Med-Surg  Consultants:  None  Procedures:  None  Antimicrobials: Ceftriaxone  Metronidazole    Subjective: Seen and examined.  Lying in bed.  Reports worsening fatigue today.  Objective: Vitals:   09/21/24 1508 09/21/24 1939 09/22/24 0312 09/22/24 0753  BP: (!) 144/61 (!) 157/59 (!) 156/65 111/86  Pulse: 65 70 70 73  Resp: 18 17 17 17   Temp: 98.3 F (36.8 C) 98.2 F (36.8 C) 98.2 F (36.8 C) 98.2 F (36.8 C)  TempSrc: Oral Oral Oral   SpO2: 95% 97% 91%   Weight:      Height:        Intake/Output Summary (Last 24 hours) at 09/22/2024 1309 Last data filed at 09/22/2024 1019 Gross per 24 hour  Intake 1040 ml  Output 2 ml  Net 1038 ml   Filed Weights   09/19/24 0602  Weight: 78.5 kg    Examination:  General exam: NAD, appears fatigued Respiratory system: Clear to auscultation. Respiratory effort normal. Cardiovascular system: S1-S2, RRR, no murmurs, no pedal edema Gastrointestinal system: Soft, NT/ND, normal bowel sounds Central nervous system: Alert and oriented. No focal neurological deficits. Extremities: Symmetric 5 x 5 power. Skin: No rashes, lesions or ulcers Psychiatry: Judgement and insight appear normal. Mood & affect appropriate.     Data Reviewed: I have personally reviewed following  labs and imaging studies  CBC: Recent Labs  Lab 09/19/24 0605 09/20/24 0647 09/21/24 1210 09/22/24 0902  WBC 16.4* 13.3* 20.0* 18.8*  NEUTROABS 14.8*  --  18.5* 17.0*  HGB 11.3* 10.9* 10.8* 10.6*  HCT 35.1*  34.0* 33.3* 33.2*  MCV 92.9 92.4 89.5 91.5  PLT 134* 92* 199 208   Basic Metabolic Panel: Recent Labs  Lab 09/19/24 0611 09/19/24 1954 09/20/24 0647 09/21/24 1210 09/22/24 0902  NA 132*  --  138 133* 137  K 3.3*  --  3.2* 4.0 3.5  CL 98  --  107 104 106  CO2 21*  --  20* 19* 22  GLUCOSE 103*  --  73 98 87  BUN 29*  --  24* 19 18  CREATININE 1.54*  --  1.22* 1.18* 1.07*  CALCIUM 9.0  --  8.5* 8.4* 8.9  MG  --  1.4*  --   --  1.8  PHOS  --  2.5  --   --  2.7   GFR: Estimated Creatinine Clearance: 42.9 mL/min (A) (by C-G formula based on SCr of 1.07 mg/dL (H)). Liver Function Tests: Recent Labs  Lab 09/19/24 0611  AST 61*  ALT 79*  ALKPHOS 259*  BILITOT 0.6  PROT 6.1*  ALBUMIN 2.8*   No results for input(s): LIPASE, AMYLASE in the last 168 hours. No results for input(s): AMMONIA in the last 168 hours. Coagulation Profile: No results for input(s): INR, PROTIME in the last 168 hours. Cardiac Enzymes: No results for input(s): CKTOTAL, CKMB, CKMBINDEX, TROPONINI in the last 168 hours. BNP (last 3 results) No results for input(s): PROBNP in the last 8760 hours. HbA1C: No results for input(s): HGBA1C in the last 72 hours. CBG: No results for input(s): GLUCAP in the last 168 hours. Lipid Profile: No results for input(s): CHOL, HDL, LDLCALC, TRIG, CHOLHDL, LDLDIRECT in the last 72 hours. Thyroid Function Tests: No results for input(s): TSH, T4TOTAL, FREET4, T3FREE, THYROIDAB in the last 72 hours. Anemia Panel: No results for input(s): VITAMINB12, FOLATE, FERRITIN, TIBC, IRON, RETICCTPCT in the last 72 hours. Sepsis Labs: Recent Labs  Lab 09/22/24 1203  PROCALCITON 1.13    Recent Results (from the past 240 hours)  Resp panel by RT-PCR (RSV, Flu A&B, Covid) Anterior Nasal Swab     Status: None   Collection Time: 09/19/24  6:11 AM   Specimen: Anterior Nasal Swab  Result Value Ref Range Status   SARS  Coronavirus 2 by RT PCR NEGATIVE NEGATIVE Final    Comment: (NOTE) SARS-CoV-2 target nucleic acids are NOT DETECTED.  The SARS-CoV-2 RNA is generally detectable in upper respiratory specimens during the acute phase of infection. The lowest concentration of SARS-CoV-2 viral copies this assay can detect is 138 copies/mL. A negative result does not preclude SARS-Cov-2 infection and should not be used as the sole basis for treatment or other patient management decisions. A negative result may occur with  improper specimen collection/handling, submission of specimen other than nasopharyngeal swab, presence of viral mutation(s) within the areas targeted by this assay, and inadequate number of viral copies(<138 copies/mL). A negative result must be combined with clinical observations, patient history, and epidemiological information. The expected result is Negative.  Fact Sheet for Patients:  bloggercourse.com  Fact Sheet for Healthcare Providers:  seriousbroker.it  This test is no t yet approved or cleared by the [place]  FDA and  has been authorized for detection and/or diagnosis of SARS-CoV-2 by FDA under an Emergency Use Authorization (EUA). This  EUA will remain  in effect (meaning this test can be used) for the duration of the COVID-19 declaration under Section 564(b)(1) of the Act, 21 U.S.C.section 360bbb-3(b)(1), unless the authorization is terminated  or revoked sooner.       Influenza A by PCR NEGATIVE NEGATIVE Final   Influenza B by PCR NEGATIVE NEGATIVE Final    Comment: (NOTE) The Xpert Xpress SARS-CoV-2/FLU/RSV plus assay is intended as an aid in the diagnosis of influenza from Nasopharyngeal swab specimens and should not be used as a sole basis for treatment. Nasal washings and aspirates are unacceptable for Xpert Xpress SARS-CoV-2/FLU/RSV testing.  Fact Sheet for  Patients: bloggercourse.com  Fact Sheet for Healthcare Providers: seriousbroker.it  This test is not yet approved or cleared by the [place]  FDA and has been authorized for detection and/or diagnosis of SARS-CoV-2 by FDA under an Emergency Use Authorization (EUA). This EUA will remain in effect (meaning this test can be used) for the duration of the COVID-19 declaration under Section 564(b)(1) of the Act, 21 U.S.C. section 360bbb-3(b)(1), unless the authorization is terminated or revoked.     Resp Syncytial Virus by PCR NEGATIVE NEGATIVE Final    Comment: (NOTE) Fact Sheet for Patients: bloggercourse.com  Fact Sheet for Healthcare Providers: seriousbroker.it  This test is not yet approved or cleared by the [place]  FDA and has been authorized for detection and/or diagnosis of SARS-CoV-2 by FDA under an Emergency Use Authorization (EUA). This EUA will remain in effect (meaning this test can be used) for the duration of the COVID-19 declaration under Section 564(b)(1) of the Act, 21 U.S.C. section 360bbb-3(b)(1), unless the authorization is terminated or revoked.  Performed at [facility], [address] [place], [place] [address]   C Difficile Quick Screen w PCR reflex     Status: None   Collection Time: 09/20/24  7:18 AM   Specimen: STOOL  Result Value Ref Range Status   C Diff antigen NEGATIVE NEGATIVE Final   C Diff toxin NEGATIVE NEGATIVE Final   C Diff interpretation No C. difficile detected.  Final    Comment: Performed at [facility], [address] [place], [place] [address]         Radiology Studies: No results found.       Scheduled Meds:  acidophilus  1 capsule Oral TID   busPIRone   7.5 mg Oral BID   escitalopram   10 mg Oral Daily   feeding supplement  237 mL Oral BID BM   heparin   5,000 Units Subcutaneous Q12H    metoprolol  tartrate  25 mg Oral BID   pantoprazole   40 mg Oral Daily   pravastatin   20 mg Oral Daily   predniSONE   5 mg Oral Q breakfast   psyllium  1 packet Oral BID   ursodiol   300 mg Oral TID   Continuous Infusions:  cefTRIAXone  (ROCEPHIN )  IV 2 g (09/22/24 0926)   metronidazole  500 mg (09/22/24 0925)     LOS: 3 days     Calvin KATHEE Robson, MD [facility]   If 7PM-7AM, please contact night-coverage  09/22/2024, 1:09 PM   "

## 2024-09-23 DIAGNOSIS — K5792 Diverticulitis of intestine, part unspecified, without perforation or abscess without bleeding: Secondary | ICD-10-CM | POA: Diagnosis not present

## 2024-09-23 MED ORDER — DICLOFENAC SODIUM 1 % EX GEL
4.0000 g | Freq: Four times a day (QID) | CUTANEOUS | Status: DC
  Administered 2024-09-23 – 2024-09-25 (×7): 4 g via TOPICAL
  Filled 2024-09-23: qty 100

## 2024-09-23 MED ORDER — LABETALOL HCL 5 MG/ML IV SOLN
20.0000 mg | INTRAVENOUS | Status: DC | PRN
  Administered 2024-09-25: 20 mg via INTRAVENOUS
  Filled 2024-09-23: qty 4

## 2024-09-23 NOTE — Plan of Care (Signed)
" °  Problem: Education: Goal: Knowledge of General Education information will improve Description: Including pain rating scale, medication(s)/side effects and non-pharmacologic comfort measures Outcome: Progressing   Problem: Clinical Measurements: Goal: Ability to maintain clinical measurements within normal limits will improve Outcome: Progressing Goal: Will remain free from infection Outcome: Progressing Goal: Diagnostic test results will improve Outcome: Progressing Goal: Cardiovascular complication will be avoided Outcome: Progressing   Problem: Activity: Goal: Risk for activity intolerance will decrease Outcome: Progressing   Problem: Nutrition: Goal: Adequate nutrition will be maintained Outcome: Progressing   Problem: Elimination: Goal: Will not experience complications related to bowel motility Outcome: Progressing Goal: Will not experience complications related to urinary retention Outcome: Progressing   Problem: Pain Managment: Goal: General experience of comfort will improve and/or be controlled Outcome: Progressing   Problem: Safety: Goal: Ability to remain free from injury will improve Outcome: Progressing   "

## 2024-09-23 NOTE — Plan of Care (Signed)
" °  Problem: Education: Goal: Knowledge of General Education information will improve Description: Including pain rating scale, medication(s)/side effects and non-pharmacologic comfort measures Outcome: Progressing   Problem: Health Behavior/Discharge Planning: Goal: Ability to manage health-related needs will improve Outcome: Progressing   Problem: Clinical Measurements: Goal: Ability to maintain clinical measurements within normal limits will improve Outcome: Progressing Goal: Diagnostic test results will improve Outcome: Progressing Goal: Cardiovascular complication will be avoided Outcome: Progressing   Problem: Activity: Goal: Risk for activity intolerance will decrease Outcome: Progressing   Problem: Nutrition: Goal: Adequate nutrition will be maintained Outcome: Progressing   Problem: Coping: Goal: Level of anxiety will decrease Outcome: Progressing   Problem: Clinical Measurements: Goal: Respiratory complications will improve Outcome: Not Applicable   "

## 2024-09-23 NOTE — Progress Notes (Signed)
 PT Cancellation Note  Patient Details Name: Sandra [name] MRN: [id number] DOB: Jan 29, 1941   Cancelled Treatment:    Reason Eval/Treat Not Completed: Other (comment) Pt was very enthusiastic about being active, but reports she worked with OT and mobility techs earlier today and that he arthritis (especially in her knees) is acting up today and she does not feel like working with PT would go well this date; pleasantly refused.  [name] 09/23/2024, 4:03 PM

## 2024-09-23 NOTE — Progress Notes (Signed)
 Occupational Therapy Treatment Patient Details Name: Sandra [name] MRN: [id number] DOB: 1940/10/12 Today's Date: 09/23/2024   History of present illness 84 y/o female presented to ED on 09/19/24 for worsening diarrhea. Admitted for acute colitis/diverticulitis. PMH: RA, HTN   OT comments  Pt received semi-reclined in bed. Appearing alert; willing to work with OT on grooming at sink. Requiring seated grooming 2/2 knee pain, but able to get up and sit on BSC with MIN A. See flowsheet below for further details of session. Left semi-reclined with heating pad on BIL knees, daughter in room, with all needs in reach.  Patient will benefit from continued OT while in acute care.       If plan is discharge home, recommend the following:  A lot of help with bathing/dressing/bathroom;A little help with walking and/or transfers;Help with stairs or ramp for entrance;Assist for transportation;Assistance with cooking/housework   Equipment Recommendations  Other (comment) (defer to next venue of care)    Recommendations for Other Services      Precautions / Restrictions Precautions Precautions: Fall Recall of Precautions/Restrictions: Intact Restrictions Weight Bearing Restrictions Per Provider Order: No       Mobility Bed Mobility Overal bed mobility: Needs Assistance Bed Mobility: Supine to Sit     Supine to sit: Min assist, HOB elevated Sit to supine: Mod assist, HOB elevated   General bed mobility comments: Pt able to use OT's hand to pull herself up from supine to sit.    Transfers Overall transfer level: Needs assistance Equipment used: Rolling walker (2 wheels) Transfers: Sit to/from Stand, Bed to chair/wheelchair/BSC Sit to Stand: Min assist, From elevated surface     Step pivot transfers: Min assist (poor eccentric control)     General transfer comment: cues for RW hand placement during STS transfer; assist from hips for STS with elevated bed.     Balance Overall  balance assessment: Needs assistance Sitting-balance support: Feet supported Sitting balance-Leahy Scale: Good     Standing balance support: During functional activity, Bilateral upper extremity supported, Reliant on assistive device for balance Standing balance-Leahy Scale: Fair Standing balance comment: RW and CGA                           ADL either performed or assessed with clinical judgement   ADL Overall ADL's : Needs assistance/impaired     Grooming: Sitting;Set up Grooming Details (indicate cue type and reason): set up while seated on BSC in front of sink; pt unable to reach forward for sink, so OT assisted with water and basin.                 Toilet Transfer: Contact guard assist;BSC/3in1;Rolling walker (2 wheels) Toilet Transfer Details (indicate cue type and reason): Poor eccentric control on stand to sit t/f; pt states I plop. That's just how I do. Toileting- Clothing Manipulation and Hygiene: Total assistance;Sit to/from stand Toileting - Clothing Manipulation Details (indicate cue type and reason): pt standing at RW while OT assists dependently with wet wipes for bowel hygiene.     Functional mobility during ADLs: Contact guard assist;Minimal assistance;Rolling walker (2 wheels) General ADL Comments: Slow mobility taking a few steps forward toward the sink, then needing to sit 2/2 knee pain and needing to have bowel movement on BSC; OT placed BSC behind her.    Extremity/Trunk Assessment Upper Extremity Assessment Upper Extremity Assessment: Generalized weakness (RA in BIL hands)   Lower Extremity Assessment Lower Extremity Assessment:  Generalized weakness (RA affecting knees)        Vision       Perception     Praxis     Communication Communication Communication: No apparent difficulties   Cognition Arousal: Alert Behavior During Therapy: WFL for tasks assessed/performed Cognition: No apparent impairments             OT -  Cognition Comments: Pleasant, motivated.                 Following commands: Intact        Cueing   Cueing Techniques: Verbal cues  Exercises      Shoulder Instructions       General Comments Pt on room air throughout session. Had small loose (but not liquid) BM while seated on BSC. RN notified.    Pertinent Vitals/ Pain       Pain Assessment Pain Assessment: 0-10 Pain Score:  (unrated; moderate; chronic) Pain Location: BIL knees, R arm Pain Descriptors / Indicators: Aching, Discomfort, Grimacing Pain Intervention(s): Limited activity within patient's tolerance, Heat applied (heat applied at end of session per pt request.)  Home Living                                          Prior Functioning/Environment              Frequency  Min 2X/week        Progress Toward Goals  OT Goals(current goals can now be found in the care plan section)  Progress towards OT goals: Progressing toward goals  Acute Rehab OT Goals Patient Stated Goal: get stronger and back to baseline OT Goal Formulation: With patient Time For Goal Achievement: 10/05/24 Potential to Achieve Goals: Fair ADL Goals Pt Will Perform Upper Body Bathing: sitting;with supervision Pt Will Perform Lower Body Dressing: with min assist;sitting/lateral leans;sit to/from stand Pt Will Transfer to Toilet: with supervision;ambulating Additional ADL Goal #1: Pt will demo 1 learned ECS during ADL performance 2/2 trials to maximize safety/indep and prevent overexertion.  Plan      Co-evaluation                 AM-PAC OT 6 Clicks Daily Activity     Outcome Measure   Help from another person eating meals?: A Little Help from another person taking care of personal grooming?: A Little Help from another person toileting, which includes using toliet, bedpan, or urinal?: A Lot Help from another person bathing (including washing, rinsing, drying)?: A Lot Help from another person to  put on and taking off regular upper body clothing?: A Lot Help from another person to put on and taking off regular lower body clothing?: A Lot 6 Click Score: 14    End of Session Equipment Utilized During Treatment: Rolling walker (2 wheels)  OT Visit Diagnosis: Other abnormalities of gait and mobility (R26.89);Muscle weakness (generalized) (M62.81)   Activity Tolerance Patient tolerated treatment well   Patient Left in bed;with call bell/phone within reach;with bed alarm set;with family/visitor present (daughter present; heating pad on BIL knees with sheet between pad and pt's skin.)   Nurse Communication Mobility status;Other (comment) (BM, heating pad.)        Time: 8954-8883 OT Time Calculation (min): 31 min  Charges: OT General Charges $OT Visit: 1 Visit OT Treatments $Self Care/Home Management : 23-37 mins  Jasmine Arlean Shams, MS, OTR/L  Jasmine Shams 09/23/2024, 11:33 AM

## 2024-09-23 NOTE — Progress Notes (Signed)
 Mobility Specialist Progress Note:    09/23/24 1158  Mobility  Activity Ambulated with assistance;Pivoted/transferred to/from [facility]  Level of Assistance Minimal assist, patient does 75% or more  Assistive Device Front wheel walker  Distance Ambulated (ft) 5 ft  Range of Motion/Exercises Active;All extremities  Activity Response Tolerated well  Mobility visit 1 Mobility  Mobility Specialist Start Time (ACUTE ONLY) 1152  Mobility Specialist Stop Time (ACUTE ONLY) 1205  Mobility Specialist Time Calculation (min) (ACUTE ONLY) 13 min   Pt received in bed requesting assistance. Required MinA to stand and transfer with RW. Tolerated well, asx throughout. Returned pt fowlers, alarm on and all needs met.  Sherrilee Ditty Mobility Specialist Please contact via Special Educational Needs Teacher or  Rehab office at [phone]

## 2024-09-23 NOTE — Progress Notes (Signed)
 " Progress Note   Patient: Sandra [name] FMW:[id number] DOB: [birth year] DOA: 09/19/2024     4 DOS: the patient was seen and examined on 09/23/2024   Brief hospital course:  84 y.o. female with medical history significant of rheumatoid arthritis on prednisone  and Actemra, primary biliary cholangitis, MGUS, HTN, presented with worsening of diarrhea.   Symptoms started 5 days ago when patient started to pass watery diarrhea 3-4 times a day without any mucus or blood, he has mild discomfort on lower abdomen but denied any abdominal pain, no nauseous vomiting.  After 3 days, his diarrhea appeared to be improving and started to have formed bowel movement however yesterday evening patient started to pass watery diarrhea again 3 times, when daughter decided to bring him to ED.  She denies any fever chills, no nausea vomiting no urinary symptoms.   ED Course: Afebrile, nontachycardic blood pressure 153/50.  CT abdomen pelvis showed diffuse colitis/Colitis, blood work showed WBC 16.4 hemoglobin 11.3 BUN 29 creatinine 1.5 compared to baseline 1.0, sodium 132 potassium 3.3 bicarb 21 AST 61 ALT 79.       Assessment & Plan:  Acute colitis/diverticulitis Acute diarrhea Patient states nonbloody nonbilious diarrhea increasing in frequency over the past 5 days.  GI PCR panel and C. difficile sent.  C. difficile is negative but GI PCR pending at this time Frequency of loose stools has decreased Plan: Continue ceftriaxone  Continue metronidazole  Discontinue Questran  4 g twice daily Increase psyllium to twice daily Continue probiotics Monitor vitals and fever curve Discharge pending improvement in diarrhea   AKI Likely secondary to gastrointestinal loss Improving Status post IV fluid Encourage p.o. intake   Hypokalemia Hypomagnesemia In the setting of intractable diarrhea Monitor and replace as needed   Chronic transaminitis Primary biliary cirrhosis - Stable, continue ursodiol    MGUS -  Stable   Rheumatoid arthritis Continue home prednisone  Patient receives outpatient Actemra monthly Outpatient rheumatology follow-up   HTN Continue metoprolol  Hold amlodipine   Depression PTA Lexapro        DVT prophylaxis: SQH Code Status: Full Family Communication: Daughter at bedside 1/11, via phone 1/12, at bedside 1/13 Disposition Plan: Status is: Inpatient Remains inpatient appropriate because: Acute colitis on IV antibiotics      Subjective: Seen and examined at bedside this morning in the presence of the daughter Continues to have diarrhea Denies worsening abdominal pain nausea vomiting   Examination:   General exam: NAD, appears fatigued Respiratory system: Clear to auscultation. Respiratory effort normal. Cardiovascular system: S1-S2, RRR, no murmurs, no pedal edema Gastrointestinal system: Soft, NT/ND, normal bowel sounds Central nervous system: Alert and oriented. No focal neurological deficits. Extremities: Symmetric 5 x 5 power. Skin: No rashes, lesions or ulcers Psychiatry: Judgement and insight appear normal. Mood & affect appropriate.        Data Reviewed:  Vitals:   09/22/24 1950 09/23/24 0409 09/23/24 0855 09/23/24 1443  BP: (!) 179/58 (!) 165/59 (!) 164/56 (!) 150/55  Pulse: 67 69 69 (!) 59  Resp: 18 20 18 18   Temp: 98.2 F (36.8 C) 98.5 F (36.9 C) 98.2 F (36.8 C) 98 F (36.7 C)  TempSrc: Oral Oral Oral Oral  SpO2: 98% 95% 95% 93%  Weight:      Height:          Latest Ref Rng & Units 09/22/2024    9:02 AM 09/21/2024   12:10 PM 09/20/2024    6:47 AM  CBC  WBC 4.0 - 10.5 K/uL 18.8  20.0  13.3   Hemoglobin 12.0 - 15.0 g/dL 89.3  89.1  89.0   Hematocrit 36.0 - 46.0 % 33.2  33.3  34.0   Platelets 150 - 400 K/uL 208  199  92        Latest Ref Rng & Units 09/22/2024    9:02 AM 09/21/2024   12:10 PM 09/20/2024    6:47 AM  BMP  Glucose 70 - 99 mg/dL 87  98  73   BUN 8 - 23 mg/dL 18  19  24    Creatinine 0.44 - 1.00 mg/dL 8.92  8.81   8.77   Sodium 135 - 145 mmol/L 137  133  138   Potassium 3.5 - 5.1 mmol/L 3.5  4.0  3.2   Chloride 98 - 111 mmol/L 106  104  107   CO2 22 - 32 mmol/L 22  19  20    Calcium 8.9 - 10.3 mg/dL 8.9  8.4  8.5      Author: Drue ONEIDA Potter, MD 09/23/2024 4:43 PM  For on call review [email or web address]  "

## 2024-09-23 NOTE — TOC Progression Note (Addendum)
 Transition of Care [facility]) - Progression Note    Patient Details  Name: Sandra [name] MRN: [id number] Date of Birth: Aug 24, 1941  [facility]) CM/SW Contact  [name], RN Phone Number: 09/23/2024, 10:32 AM  Clinical Narrative:     Message sent to MD to determine if patient is medically appropriate for SNF auth to be initiated    Update:  per MD patient not medically appropriate for auth to be initiated                    Expected Discharge Plan and Services                                               Social Drivers of Health (SDOH) Interventions SDOH Screenings   Food Insecurity: No Food Insecurity (09/19/2024)  Housing: Low Risk (09/19/2024)  Transportation Needs: No Transportation Needs (09/19/2024)  Utilities: Not At Risk (09/19/2024)  Depression (PHQ2-9): Low Risk (05/28/2024)  Financial Resource Strain: Low Risk  (08/11/2024)   Received from [facility]  Social Connections: Moderately Integrated (09/19/2024)  Tobacco Use: Medium Risk (09/19/2024)    Readmission Risk Interventions     No data to display

## 2024-09-24 DIAGNOSIS — K5792 Diverticulitis of intestine, part unspecified, without perforation or abscess without bleeding: Secondary | ICD-10-CM | POA: Diagnosis not present

## 2024-09-24 LAB — CBC WITH DIFFERENTIAL/PLATELET
Abs Immature Granulocytes: 0.16 K/uL — ABNORMAL HIGH (ref 0.00–0.07)
Basophils Absolute: 0 K/uL (ref 0.0–0.1)
Basophils Relative: 0 %
Eosinophils Absolute: 0 K/uL (ref 0.0–0.5)
Eosinophils Relative: 0 %
HCT: 34.9 % — ABNORMAL LOW (ref 36.0–46.0)
Hemoglobin: 10.7 g/dL — ABNORMAL LOW (ref 12.0–15.0)
Immature Granulocytes: 1 %
Lymphocytes Relative: 8 %
Lymphs Abs: 1.2 K/uL (ref 0.7–4.0)
MCH: 29 pg (ref 26.0–34.0)
MCHC: 30.7 g/dL (ref 30.0–36.0)
MCV: 94.6 fL (ref 80.0–100.0)
Monocytes Absolute: 0.8 K/uL (ref 0.1–1.0)
Monocytes Relative: 5 %
Neutro Abs: 12.2 K/uL — ABNORMAL HIGH (ref 1.7–7.7)
Neutrophils Relative %: 86 %
Platelets: 224 K/uL (ref 150–400)
RBC: 3.69 MIL/uL — ABNORMAL LOW (ref 3.87–5.11)
RDW: 16.9 % — ABNORMAL HIGH (ref 11.5–15.5)
WBC: 14.4 K/uL — ABNORMAL HIGH (ref 4.0–10.5)
nRBC: 0 % (ref 0.0–0.2)

## 2024-09-24 LAB — BASIC METABOLIC PANEL WITH GFR
Anion gap: 9 (ref 5–15)
BUN: 15 mg/dL (ref 8–23)
CO2: 23 mmol/L (ref 22–32)
Calcium: 8.6 mg/dL — ABNORMAL LOW (ref 8.9–10.3)
Chloride: 108 mmol/L (ref 98–111)
Creatinine, Ser: 0.99 mg/dL (ref 0.44–1.00)
GFR, Estimated: 57 mL/min — ABNORMAL LOW
Glucose, Bld: 74 mg/dL (ref 70–99)
Potassium: 4 mmol/L (ref 3.5–5.1)
Sodium: 140 mmol/L (ref 135–145)

## 2024-09-24 MED ORDER — ORAL CARE MOUTH RINSE: 15.0000 mL | OROMUCOSAL | Status: DC | PRN

## 2024-09-24 NOTE — Plan of Care (Signed)

## 2024-09-24 NOTE — TOC Progression Note (Addendum)
 Transition of Care [facility]) - Progression Note    Patient Details  Name: Sandra [name] MRN: [id number] Date of Birth: 1941-04-17  Transition of [facility]) CM/SW Contact  [name], RN Phone Number: 09/24/2024, 11:36 AM  Clinical Narrative:    Message sent to MD to determine if patient is medically appropriate for SNF auth to be initiated   Update:  auth started for [facility]                   Expected Discharge Plan and Services                                               Social Drivers of Health (SDOH) Interventions SDOH Screenings   Food Insecurity: No Food Insecurity (09/19/2024)  Housing: Low Risk (09/19/2024)  Transportation Needs: No Transportation Needs (09/19/2024)  Utilities: Not At Risk (09/19/2024)  Depression (PHQ2-9): Low Risk (05/28/2024)  Financial Resource Strain: Low Risk  (08/11/2024)   Received from [facility]  Social Connections: Moderately Integrated (09/19/2024)  Tobacco Use: Medium Risk (09/19/2024)    Readmission Risk Interventions     No data to display

## 2024-09-24 NOTE — Progress Notes (Signed)
 Physical Therapy Treatment Patient Details Name: Sandra [name] MRN: [id number] DOB: 1941-01-25 Today's Date: 09/24/2024   History of Present Illness Pt is an 84 y/o female presented to ED on 09/19/24 for worsening diarrhea. Admitted for acute colitis/diverticulitis. PMH: RA, HTN    PT Comments  Pt was pleasant and motivated to participate during the session and put forth good effort throughout. Pt, with daughter present, educated on log roll technique during bed mobility training for decreased caregiver assistance with pt requiring cuing and then only min A to go from sup to/from sit.  Pt was able to come to standing with cuing for proper sequencing and min A and once in standing was able to amb a self-selected max of around 6 feet with small, effortful steps but with no LOB or buckling.  Pt demonstrated poor eccentric control while returning to sitting and would benefit from practice going forward for improved control during descent.  Pt will benefit from continued PT services upon discharge to safely address deficits listed in patient problem list for decreased caregiver assistance and eventual return to PLOF.       If plan is discharge home, recommend the following: A little help with bathing/dressing/bathroom;Assistance with cooking/housework;Assist for transportation;Help with stairs or ramp for entrance;A lot of help with walking and/or transfers   Can travel by private vehicle     No  Equipment Recommendations  Other (comment) (TBD at next venue of care)    Recommendations for Other Services       Precautions / Restrictions Precautions Precautions: Fall Recall of Precautions/Restrictions: Intact Restrictions Weight Bearing Restrictions Per Provider Order: No     Mobility  Bed Mobility Overal bed mobility: Needs Assistance Bed Mobility: Rolling, Sidelying to Sit, Sit to Sidelying Rolling: Min assist Sidelying to sit: Min assist     Sit to sidelying: Min  assist General bed mobility comments: Log roll training provided with daughter present for decreased caregiver assist    Transfers Overall transfer level: Needs assistance Equipment used: Rolling walker (2 wheels) Transfers: Sit to/from Stand Sit to Stand: Min assist           General transfer comment: Cues for hand placement during sit to/from stand and cues for increased trunk flexion needed during stand to sit with poor eccentric control; min A to come to standing    Ambulation/Gait Ambulation/Gait assistance: Contact guard assist Gait Distance (Feet): 6 Feet Assistive device: Rolling walker (2 wheels) Gait Pattern/deviations: Decreased step length - right, Decreased step length - left, Step-to pattern, Trunk flexed Gait velocity: decreased     General Gait Details: Pt able to take several steps forwards/backwards and laterally at the EOB with no overt LOB noted but fatigued quickly with max amb distance around 6 feet   Stairs             Wheelchair Mobility     Tilt Bed    Modified Rankin (Stroke Patients Only)       Balance Overall balance assessment: Needs assistance Sitting-balance support: Feet supported Sitting balance-Leahy Scale: Good     Standing balance support: During functional activity, Bilateral upper extremity supported, Reliant on assistive device for balance Standing balance-Leahy Scale: Good                              Communication Communication Communication: No apparent difficulties  Cognition Arousal: Alert Behavior During Therapy: WFL for tasks assessed/performed   PT - Cognitive impairments:  No apparent impairments                         Following commands: Intact      Cueing Cueing Techniques: Verbal cues, Tactile cues  Exercises Total Joint Exercises Ankle Circles/Pumps: AROM, Both, 5 reps Quad Sets: Strengthening, Both, 5 reps Gluteal Sets: Strengthening, Both, 5 reps Long Arc Quad: AROM,  Strengthening, Both, 10 reps Knee Flexion: AROM, Strengthening, Both, 10 reps Other Exercises Other Exercises: HEP education for BLE APs, QS, GS, and LAQs x 10 every 1-2 hours as tolerated with cues for proper breathing technique during therex    General Comments        Pertinent Vitals/Pain Pain Assessment Pain Assessment: 0-10 Pain Score: 8  Pain Location: BIL knees, R elbow Pain Descriptors / Indicators: Aching, Sore Pain Intervention(s): Monitored during session, Repositioned, Other (comment) (Pt declined pain meds at this time)    Home Living                          Prior Function            PT Goals (current goals can now be found in the care plan section) Progress towards PT goals: Progressing toward goals    Frequency    Min 2X/week      PT Plan      Co-evaluation              AM-PAC PT 6 Clicks Mobility   Outcome Measure  Help needed turning from your back to your side while in a flat bed without using bedrails?: A Little Help needed moving from lying on your back to sitting on the side of a flat bed without using bedrails?: A Little Help needed moving to and from a bed to a chair (including a wheelchair)?: A Little Help needed standing up from a chair using your arms (e.g., wheelchair or bedside chair)?: A Little Help needed to walk in hospital room?: A Lot Help needed climbing 3-5 steps with a railing? : Total 6 Click Score: 15    End of Session Equipment Utilized During Treatment: Gait belt Activity Tolerance: Patient tolerated treatment well Patient left: in bed;with call bell/phone within reach;with bed alarm set;with family/visitor present;Other (comment) (all four rails up per patient request) Nurse Communication: Mobility status PT Visit Diagnosis: Unsteadiness on feet (R26.81);Muscle weakness (generalized) (M62.81);Difficulty in walking, not elsewhere classified (R26.2);Pain Pain - part of body: Knee;Arm     Time:  8498-8471 PT Time Calculation (min) (ACUTE ONLY): 27 min  Charges:    $Therapeutic Exercise: 8-22 mins $Therapeutic Activity: 8-22 mins PT General Charges $$ ACUTE PT VISIT: 1 Visit                     D. Glendia Bertin PT, DPT 09/24/24, 3:49 PM

## 2024-09-24 NOTE — Progress Notes (Signed)
 " Progress Note   Patient: Sandra [name] FMW:[id number] DOB: [birth year] DOA: 09/19/2024     5 DOS: the patient was seen and examined on 09/24/2024      Brief hospital course:   84 y.o. female with medical history significant of rheumatoid arthritis on prednisone  and Actemra, primary biliary cholangitis, MGUS, HTN, presented with worsening of diarrhea.   Symptoms started 5 days ago when patient started to pass watery diarrhea 3-4 times a day without any mucus or blood, he has mild discomfort on lower abdomen but denied any abdominal pain, no nauseous vomiting.  After 3 days, his diarrhea appeared to be improving and started to have formed bowel movement however yesterday evening patient started to pass watery diarrhea again 3 times, when daughter decided to bring him to ED.  She denies any fever chills, no nausea vomiting no urinary symptoms.   ED Course: Afebrile, nontachycardic blood pressure 153/50.  CT abdomen pelvis showed diffuse colitis/Colitis, blood work showed WBC 16.4 hemoglobin 11.3 BUN 29 creatinine 1.5 compared to baseline 1.0, sodium 132 potassium 3.3 bicarb 21 AST 61 ALT 79.       Assessment & Plan:  Acute colitis/diverticulitis Acute diarrhea Patient states nonbloody nonbilious diarrhea increasing in frequency over the past 5 days.  GI PCR panel and C. difficile sent.  C. difficile is negative but GI PCR pending at this time Frequency of loose stools has decreased Plan: Continue ceftriaxone  Continue metronidazole  Discontinue Questran  4 g twice daily Continue psyllium  Continue probiotics Monitor vitals and fever curve Discharge pending improvement in diarrhea   AKI Likely secondary to gastrointestinal loss Improving Status post IV fluid Encourage p.o. intake   Hypokalemia Hypomagnesemia In the setting of intractable diarrhea Monitor and replace as needed   Chronic transaminitis Primary biliary cirrhosis - Stable, continue ursodiol    MGUS - Stable    Rheumatoid arthritis Continue home prednisone  Patient receives outpatient Actemra monthly Outpatient rheumatology follow-up   HTN Continue metoprolol  Hold amlodipine   Depression PTA Lexapro        DVT prophylaxis: SQH Code Status: Full Family Communication: Daughter at bedside 1/11, via phone 1/12, at bedside 1/13 Disposition Plan: Status is: Inpatient Remains inpatient appropriate because: Acute colitis on IV antibiotics   Subjective: Seen and examined at bedside this morning in the presence of the daughter Diarrhea improved today compared to yesterday TOC have started authorization   Examination:   General exam: NAD, appears fatigued Respiratory system: Clear to auscultation. Respiratory effort normal. Cardiovascular system: S1-S2, RRR, no murmurs, no pedal edema Gastrointestinal system: Soft, NT/ND, normal bowel sounds Central nervous system: Alert and oriented. No focal neurological deficits. Extremities: Symmetric 5 x 5 power. Skin: No rashes, lesions or ulcers Psychiatry: Judgement and insight appear normal. Mood & affect appropriate.        Data Reviewed:      Latest Ref Rng & Units 09/24/2024    6:03 AM 09/22/2024    9:02 AM 09/21/2024   12:10 PM  BMP  Glucose 70 - 99 mg/dL 74  87  98   BUN 8 - 23 mg/dL 15  18  19    Creatinine 0.44 - 1.00 mg/dL 9.00  8.92  8.81   Sodium 135 - 145 mmol/L 140  137  133   Potassium 3.5 - 5.1 mmol/L 4.0  3.5  4.0   Chloride 98 - 111 mmol/L 108  106  104   CO2 22 - 32 mmol/L 23  22  19    Calcium 8.9 -  10.3 mg/dL 8.6  8.9  8.4        Latest Ref Rng & Units 09/24/2024    6:03 AM 09/22/2024    9:02 AM 09/21/2024   12:10 PM  CBC  WBC 4.0 - 10.5 K/uL 14.4  18.8  20.0   Hemoglobin 12.0 - 15.0 g/dL 89.2  89.3  89.1   Hematocrit 36.0 - 46.0 % 34.9  33.2  33.3   Platelets 150 - 400 K/uL 224  208  199     Vitals:   09/23/24 2028 09/24/24 0300 09/24/24 0435 09/24/24 0814  BP: (!) 156/56 (!) 158/58 (!) 187/59 (!) 164/55  Pulse:  67 62 65 67  Resp: 18 18 18 18   Temp: 98.1 F (36.7 C) 98.1 F (36.7 C) 98.1 F (36.7 C) 98.1 F (36.7 C)  TempSrc: Oral Oral  Oral  SpO2: 94% 95% 96% 96%  Weight:      Height:         Author: Drue ONEIDA Potter, MD 09/24/2024 5:56 PM  For on call review [email or web address]  "

## 2024-09-25 DIAGNOSIS — K5792 Diverticulitis of intestine, part unspecified, without perforation or abscess without bleeding: Secondary | ICD-10-CM | POA: Diagnosis not present

## 2024-09-25 MED ORDER — AMOXICILLIN-POT CLAVULANATE 875-125 MG PO TABS: 1.0000 | ORAL_TABLET | Freq: Two times a day (BID) | ORAL | Status: AC

## 2024-09-25 MED ORDER — SENNOSIDES-DOCUSATE SODIUM 8.6-50 MG PO TABS: 1.0000 | ORAL_TABLET | Freq: Every evening | ORAL | Status: AC | PRN

## 2024-09-25 MED ORDER — PSYLLIUM 95 % PO PACK: 1.0000 | PACK | Freq: Two times a day (BID) | ORAL | Status: AC

## 2024-09-25 NOTE — TOC Transition Note (Signed)
 Transition of Care [facility]) - Discharge Note   Patient Details  Name: Sandra [name] MRN: [id number] Date of Birth: 07/28/1941  [facility]) CM/SW Contact:  Alfonso Rummer, LCSW Phone Number: 09/25/2024, 11:09 AM   Clinical Narrative:    [facility] to [facility] D11. Nurse advise to call report. Family at bedside.    Final next level of care: Skilled Nursing Facility Barriers to Discharge: Barriers Resolved   Patient Goals and CMS Choice     Choice offered to / list presented to : Patient, Adult Children      Discharge Placement PASRR number recieved: 09/25/24            Patient chooses bed at: Other - please specify in the comment section below: Patient to be transferred to facility by: [facility] Name of family member notified: pt and daughter Patient and family notified of of transfer: 09/25/24  Discharge Plan and Services Additional resources added to the After Visit Summary for Autism/IDD                                     Social Drivers of Health (SDOH) Interventions SDOH Screenings   Food Insecurity: No Food Insecurity (09/19/2024)  Housing: Low Risk (09/19/2024)  Transportation Needs: No Transportation Needs (09/19/2024)  Utilities: Not At Risk (09/19/2024)  Depression (PHQ2-9): Low Risk (05/28/2024)  Financial Resource Strain: Low Risk  (08/11/2024)   Received from [facility]  Social Connections: Moderately Integrated (09/19/2024)  Tobacco Use: Medium Risk (09/19/2024)     Readmission Risk Interventions     No data to display

## 2024-09-25 NOTE — Plan of Care (Signed)
  Problem: Education: Goal: Knowledge of General Education information will improve Description: Including pain rating scale, medication(s)/side effects and non-pharmacologic comfort measures Outcome: Adequate for Discharge   Problem: Health Behavior/Discharge Planning: Goal: Ability to manage health-related needs will improve Outcome: Adequate for Discharge   Problem: Clinical Measurements: Goal: Ability to maintain clinical measurements within normal limits will improve Outcome: Adequate for Discharge Goal: Will remain free from infection Outcome: Adequate for Discharge Goal: Diagnostic test results will improve Outcome: Adequate for Discharge Goal: Cardiovascular complication will be avoided Outcome: Adequate for Discharge   Problem: Activity: Goal: Risk for activity intolerance will decrease Outcome: Adequate for Discharge   Problem: Nutrition: Goal: Adequate nutrition will be maintained Outcome: Adequate for Discharge   Problem: Coping: Goal: Level of anxiety will decrease Outcome: Adequate for Discharge   Problem: Elimination: Goal: Will not experience complications related to bowel motility Outcome: Adequate for Discharge Goal: Will not experience complications related to urinary retention Outcome: Adequate for Discharge   Problem: Pain Managment: Goal: General experience of comfort will improve and/or be controlled Outcome: Adequate for Discharge   Problem: Safety: Goal: Ability to remain free from injury will improve Outcome: Adequate for Discharge   Problem: Skin Integrity: Goal: Risk for impaired skin integrity will decrease Outcome: Adequate for Discharge

## 2024-09-25 NOTE — Progress Notes (Signed)
 Report given to RN receiving patient at [facility]

## 2024-09-25 NOTE — Progress Notes (Signed)
 Mobility Specialist - Progress Note   09/25/24 1043  Mobility  Activity Pivoted/transferred to/from [facility]  Level of Assistance Contact guard assist, steadying assist  Assistive Device Front wheel walker;BSC  Distance Ambulated (ft) 2 ft  Activity Response Tolerated well  Mobility visit 1 Mobility  Mobility Specialist Start Time (ACUTE ONLY) 1026  Mobility Specialist Stop Time (ACUTE ONLY) 1030  Mobility Specialist Time Calculation (min) (ACUTE ONLY) 4 min   Pt semi fowler upon entry, utilizing RA. Pt transfer to the [facility] via SPT, extra time required once standing. Pt left seated with instructions to utilize the call bell when ready, family at bedside.  America Silvan Mobility Specialist 09/25/24 10:46 AM

## 2024-09-25 NOTE — Discharge Summary (Signed)
 " Physician Discharge Summary   Patient: Sandra [name] MRN: [id number] DOB: 09/12/1940  Admit date:     09/19/2024  Discharge date: 09/25/24  Discharge Physician: Sandra [name]   PCP: Sandra Idelia DELENA, MD   Recommendations at discharge:  Follow-up with PCP  Discharge Diagnoses:  Acute colitis/diverticulitis Acute diarrhea AKI Hypokalemia Hypomagnesemia Chronic transaminitis Primary biliary cirrhosis MGUS Rheumatoid arthritis HTN Depression  Hospital Course:   84 y.o. female with medical history significant of rheumatoid arthritis on prednisone  and Actemra, primary biliary cholangitis, MGUS, HTN, presented with worsening of diarrhea.   Symptoms started 5 days ago when patient started to pass watery diarrhea 3-4 times a day without any mucus or blood, he has mild discomfort on lower abdomen but denied any abdominal pain, no nauseous vomiting.  After 3 days, his diarrhea appeared to be improving and started to have formed bowel movement however yesterday evening patient started to pass watery diarrhea again 3 times, when daughter decided to bring him to ED.  She denies any fever chills, no nausea vomiting no urinary symptoms.   ED Course: Afebrile, nontachycardic blood pressure 153/50.  CT abdomen pelvis showed diffuse colitis/Colitis, blood work showed WBC 16.4 hemoglobin 11.3 BUN 29 creatinine 1.5 compared to baseline 1.0, sodium 132 potassium 3.3 bicarb 21 AST 61 ALT 79.     Other detailed assessment and plan as listed below   Assessment & Plan:  Acute colitis/diverticulitis Acute diarrhea Patient states nonbloody nonbilious diarrhea increasing in frequency over the past 5 days.  GI PCR panel and C. difficile sent.  C. difficile is negative but GI PCR pending at this time Frequency of loose stools has decreased Plan: Patient received ceftriaxone  and metronidazole  ordered have been switched to Augmentin  at discharge Continue psyllium    AKI-improved with IV fluids    Hypokalemia Hypomagnesemia Improved   Chronic transaminitis Primary biliary cirrhosis - Stable, continue ursodiol    MGUS - Stable   Rheumatoid arthritis Continue home prednisone  Patient receives outpatient Actemra monthly Outpatient rheumatology follow-up   HTN Continue metoprolol  Hold amlodipine   Depression PTA Lexapro      Consultants: None Procedures performed: None Disposition: Skilled nursing facility Diet recommendation:  Cardiac diet DISCHARGE MEDICATION: Allergies as of 09/25/2024       Reactions   Simvastatin    Pt unsure of reaction, didn't agree with me        Medication List     STOP taking these medications    CHOLECALCIFEROL PO   ibandronate 150 MG tablet Commonly known as: BONIVA       TAKE these medications    amLODipine 5 MG tablet Commonly known as: NORVASC Take 5 mg by mouth daily.   amoxicillin -clavulanate 875-125 MG tablet Commonly known as: AUGMENTIN  Take 1 tablet by mouth 2 (two) times daily for 5 days.   aspirin EC 81 MG tablet Take 81 mg by mouth daily.   busPIRone  7.5 MG tablet Commonly known as: BUSPAR  Take 7.5 mg by mouth 2 (two) times daily.   CALTRATE 600+D PO Take by mouth.   escitalopram  10 MG tablet Commonly known as: LEXAPRO  Take 10 mg by mouth daily.   furosemide 20 MG tablet Commonly known as: LASIX Take 20 mg by mouth 2 (two) times daily as needed for fluid or edema.   metoprolol  tartrate 50 MG tablet Commonly known as: LOPRESSOR  Take 25 mg by mouth 2 (two) times daily.   omeprazole 20 MG capsule Commonly known as: PRILOSEC Take 20 mg by mouth  daily.   pravastatin  20 MG tablet Commonly known as: PRAVACHOL  Take 20 mg by mouth daily.   predniSONE  5 MG tablet Commonly known as: DELTASONE  Take 2.5 mg by mouth daily with breakfast.   psyllium 95 % Pack Commonly known as: HYDROCIL/METAMUCIL Take 1 packet by mouth 2 (two) times daily.   risedronate 150 MG tablet Commonly known as:  ACTONEL Take 150 mg by mouth every 30 (thirty) days. with water on empty stomach, nothing by mouth or lie down for next 30 minutes.   senna-docusate 8.6-50 MG tablet Commonly known as: Senokot-S Take 1 tablet by mouth at bedtime as needed for mild constipation.   ursodiol  250 MG tablet Commonly known as: ACTIGALL  Take 500 mg by mouth 2 (two) times daily.        Contact information for after-discharge care     Destination     [facility] .   Service: Skilled Nursing Contact information: [address] [place]  [address] [phone]                    Discharge Exam: Sandra [name]   09/19/24 0602  Weight: 78.5 kg   General exam: NAD, in no acute distress Respiratory system: Clear to auscultation. Respiratory effort normal. Cardiovascular system: S1-S2, RRR, no murmurs, no pedal edema Gastrointestinal system: Soft, NT/ND, normal bowel sounds Central nervous system: Alert and oriented. No focal neurological deficits. Extremities: Symmetric 5 x 5 power. Skin: No rashes, lesions or ulcers Psychiatry: Judgement and insight appear normal. Mood & affect appropriate.     Condition at discharge: good  The results of significant diagnostics from this hospitalization (including imaging, microbiology, ancillary and laboratory) are listed below for reference.   Imaging Studies: CT ABDOMEN PELVIS W CONTRAST Result Date: 09/19/2024 EXAM: CT ABDOMEN AND PELVIS WITH CONTRAST 09/19/2024 07:52:02 AM TECHNIQUE: CT of the abdomen and pelvis was performed with the administration of 80 mL of iohexol  (OMNIPAQUE ) 300 MG/ML solution. Multiplanar reformatted images are provided for review. Automated exposure control, iterative reconstruction, and/or weight-based adjustment of the mA/kV was utilized to reduce the radiation dose to as low as reasonably achievable. COMPARISON: CT abdomen 03/27/2021. CLINICAL HISTORY: 84 year old female. LLQ abdominal pain  and diarrhea for 5 days. FINDINGS: LOWER CHEST: Suggestion of chronic staple line in the right middle lobe on previous CT. Increased peribronchial but curvilinear and platelike opacity in the right middle lobe most resembling atelectasis. Small new layering right pleural effusion with simple fluid density. No pericardial effusion. Mild bilateral lower lobe atelectasis has also increased. LIVER: Periportal edema might also be present in addition to the intrahepatic dilated bile ducts. No discrete liver lesion. GALLBLADDER AND BILE DUCTS: Chronic cholecystectomy. Intra and extrahepatic biliary ductal dilatation appears diffusely progressed since 2022. The common bile duct now is up to 11 mm diameter (coronal image 38), however, seems to taper distally with no discrete ductal defect by CT (coronal image 42). SPLEEN: No acute abnormality. PANCREAS: No acute abnormality. ADRENAL GLANDS: No acute abnormality. KIDNEYS, URETERS AND BLADDER: Early contrast excretion from both kidneys on the delayed images. No stones in the kidneys or ureters. No hydronephrosis. No perinephric or periureteral stranding. Urinary bladder is unremarkable. GI AND BOWEL: Small fluid level in the distal esophagus which is not significantly dilated. Diminutive and normal appendix series 2 image 54. Severe diverticulosis throughout the large bowel. No active inflammation in the sigmoid colon. Widespread bowel wall thickening and indistinct appearance throughout the ascending and transverse  colon, descending colon (series 2 image 48). Nondilated fluid containing small bowel. Decompressed stomach. There is no bowel obstruction. No pneumoperitoneum. PERITONEUM AND RETROPERITONEUM: Abnormal free fluid in the bilateral pelvis, small volume and simple fluid density. No free air. No pneumoperitoneum. VASCULATURE: Mild infrarenal abdominal aortic aneurysm measures 3 cm diameter on series 2 image 42. Superimposed severe abdominal aortic atherosclerosis with  both soft and calcified plaque. Severe bilateral iliac artery atherosclerosis. Major arterial structures and the portal venous system remain patent. LYMPH NODES: No lymphadenopathy. REPRODUCTIVE ORGANS: Negative uterus and adnexa. BONES AND SOFT TISSUES: Ordinary degenerative changes in the spine. No acute osseous abnormality. No focal soft tissue abnormality. IMPRESSION: 1. Widespread acute colitis/diverticulitis. Small volume likely reactive free fluid in the pelvis. No bowel obstruction or perforation. 2. Progressed diffuse intra- and extrahepatic biliary ductal dilatation since 2022 (CBD now 11 mm) with no ductal stone or obstructing etiology by CT. Possible superimposed acute hepatic periportal edema also. 3. Small new layering right pleural effusion.  Bilateral lung base atelectasis. 4. Mild infrarenal abdominal aortic aneurysm (3.0 cm), recommend ultrasound follow-up in 3 years. Advanced aortic atherosclerosis. Electronically signed by: Helayne Hurst MD MD 09/19/2024 08:12 AM EST RP Workstation: [id number]    Microbiology: Results for orders placed or performed during the hospital encounter of 09/19/24  Resp panel by RT-PCR (RSV, Flu A&B, Covid) Anterior Nasal Swab     Status: None   Collection Time: 09/19/24  6:11 AM   Specimen: Anterior Nasal Swab  Result Value Ref Range Status   SARS Coronavirus 2 by RT PCR NEGATIVE NEGATIVE Final    Comment: (NOTE) SARS-CoV-2 target nucleic acids are NOT DETECTED.  The SARS-CoV-2 RNA is generally detectable in upper respiratory specimens during the acute phase of infection. The lowest concentration of SARS-CoV-2 viral copies this assay can detect is 138 copies/mL. A negative result does not preclude SARS-Cov-2 infection and should not be used as the sole basis for treatment or other patient management decisions. A negative result may occur with  improper specimen collection/handling, submission of specimen other than nasopharyngeal swab, presence of  viral mutation(s) within the areas targeted by this assay, and inadequate number of viral copies(<138 copies/mL). A negative result must be combined with clinical observations, patient history, and epidemiological information. The expected result is Negative.  Fact Sheet for Patients:  bloggercourse.com  Fact Sheet for Healthcare Providers:  seriousbroker.it  This test is no t yet approved or cleared by the [place]  FDA and  has been authorized for detection and/or diagnosis of SARS-CoV-2 by FDA under an Emergency Use Authorization (EUA). This EUA will remain  in effect (meaning this test can be used) for the duration of the COVID-19 declaration under Section 564(b)(1) of the Act, 21 U.S.C.section 360bbb-3(b)(1), unless the authorization is terminated  or revoked sooner.       Influenza A by PCR NEGATIVE NEGATIVE Final   Influenza B by PCR NEGATIVE NEGATIVE Final    Comment: (NOTE) The Xpert Xpress SARS-CoV-2/FLU/RSV plus assay is intended as an aid in the diagnosis of influenza from Nasopharyngeal swab specimens and should not be used as a sole basis for treatment. Nasal washings and aspirates are unacceptable for Xpert Xpress SARS-CoV-2/FLU/RSV testing.  Fact Sheet for Patients: bloggercourse.com  Fact Sheet for Healthcare Providers: seriousbroker.it  This test is not yet approved or cleared by the [place]  FDA and has been authorized for detection and/or diagnosis of SARS-CoV-2 by FDA under an Emergency Use Authorization (EUA). This EUA will remain in effect (  meaning this test can be used) for the duration of the COVID-19 declaration under Section 564(b)(1) of the Act, 21 U.S.C. section 360bbb-3(b)(1), unless the authorization is terminated or revoked.     Resp Syncytial Virus by PCR NEGATIVE NEGATIVE Final    Comment: (NOTE) Fact Sheet for  Patients: bloggercourse.com  Fact Sheet for Healthcare Providers: seriousbroker.it  This test is not yet approved or cleared by the [place]  FDA and has been authorized for detection and/or diagnosis of SARS-CoV-2 by FDA under an Emergency Use Authorization (EUA). This EUA will remain in effect (meaning this test can be used) for the duration of the COVID-19 declaration under Section 564(b)(1) of the Act, 21 U.S.C. section 360bbb-3(b)(1), unless the authorization is terminated or revoked.  Performed at [facility], [address] [place], [place] [address]   C Difficile Quick Screen w PCR reflex     Status: None   Collection Time: 09/20/24  7:18 AM   Specimen: STOOL  Result Value Ref Range Status   C Diff antigen NEGATIVE NEGATIVE Final   C Diff toxin NEGATIVE NEGATIVE Final   C Diff interpretation No C. difficile detected.  Final    Comment: Performed at [facility], [address] [place], [place] [address]  GI pathogen panel by PCR, stool     Status: Abnormal   Collection Time: 09/20/24  7:18 AM  Result Value Ref Range Status   Plesiomonas shigelloides NOT DETECTED NOT DETECTED Final   Yersinia enterocolitica NOT DETECTED NOT DETECTED Final   Vibrio NOT DETECTED NOT DETECTED Final   Enteropathogenic E coli DETECTED (A) NOT DETECTED Final   E coli (ETEC) LT/ST NOT DETECTED NOT DETECTED Final   E coli 0157 by PCR Not applicable NOT DETECTED Final   Cryptosporidium by PCR NOT DETECTED NOT DETECTED Final   Entamoeba histolytica NOT DETECTED NOT DETECTED Final   Adenovirus F 40/41 NOT DETECTED NOT DETECTED Final   Norovirus GI/GII NOT DETECTED NOT DETECTED Final   Sapovirus NOT DETECTED NOT DETECTED Final    Comment: (NOTE) Performed At: [facility] [address] [place], [place] [id number] Jennette Shorter MD Ey:[phone]    Vibrio cholerae NOT DETECTED NOT DETECTED Final   Campylobacter  by PCR NOT DETECTED NOT DETECTED Final   Salmonella by PCR NOT DETECTED NOT DETECTED Final   E coli (STEC) NOT DETECTED NOT DETECTED Final   Enteroaggregative E coli NOT DETECTED NOT DETECTED Final   Shigella by PCR NOT DETECTED NOT DETECTED Final   Cyclospora cayetanensis NOT DETECTED NOT DETECTED Final   Astrovirus NOT DETECTED NOT DETECTED Final   G lamblia by PCR NOT DETECTED NOT DETECTED Final   Rotavirus A by PCR NOT DETECTED NOT DETECTED Final    Labs: CBC: Recent Labs  Lab 09/19/24 0605 09/20/24 0647 09/21/24 1210 09/22/24 0902 09/24/24 0603  WBC 16.4* 13.3* 20.0* 18.8* 14.4*  NEUTROABS 14.8*  --  18.5* 17.0* 12.2*  HGB 11.3* 10.9* 10.8* 10.6* 10.7*  HCT 35.1* 34.0* 33.3* 33.2* 34.9*  MCV 92.9 92.4 89.5 91.5 94.6  PLT 134* 92* 199 208 224   Basic Metabolic Panel: Recent Labs  Lab 09/19/24 0611 09/19/24 1954 09/20/24 0647 09/21/24 1210 09/22/24 0902 09/24/24 0603  NA 132*  --  138 133* 137 140  K 3.3*  --  3.2* 4.0 3.5 4.0  CL 98  --  107 104 106 108  CO2 21*  --  20* 19* 22 23  GLUCOSE 103*  --  73  98 87 74  BUN 29*  --  24* 19 18 15   CREATININE 1.54*  --  1.22* 1.18* 1.07* 0.99  CALCIUM 9.0  --  8.5* 8.4* 8.9 8.6*  MG  --  1.4*  --   --  1.8  --   PHOS  --  2.5  --   --  2.7  --    Liver Function Tests: Recent Labs  Lab 09/19/24 0611  AST 61*  ALT 79*  ALKPHOS 259*  BILITOT 0.6  PROT 6.1*  ALBUMIN 2.8*   CBG: No results for input(s): GLUCAP in the last 168 hours.  Discharge time spent:  37 minutes.  Signed: Drue ONEIDA Potter, MD [facility] 09/25/2024 "

## 2025-05-14 ENCOUNTER — Other Ambulatory Visit

## 2025-05-28 ENCOUNTER — Ambulatory Visit: Admitting: Internal Medicine
